# Patient Record
Sex: Male | Born: 1940
Health system: Southern US, Community
[De-identification: ages and names within clinical notes are randomized; demographics above are authoritative.]

## PROBLEM LIST (undated history)

## (undated) DIAGNOSIS — N400 Enlarged prostate without lower urinary tract symptoms: Secondary | ICD-10-CM

## (undated) DIAGNOSIS — K649 Unspecified hemorrhoids: Secondary | ICD-10-CM

## (undated) DIAGNOSIS — Z923 Personal history of irradiation: Secondary | ICD-10-CM

## (undated) DIAGNOSIS — N139 Obstructive and reflux uropathy, unspecified: Secondary | ICD-10-CM

## (undated) DIAGNOSIS — Z8489 Family history of other specified conditions: Secondary | ICD-10-CM

## (undated) DIAGNOSIS — E785 Hyperlipidemia, unspecified: Secondary | ICD-10-CM

## (undated) DIAGNOSIS — R011 Cardiac murmur, unspecified: Secondary | ICD-10-CM

## (undated) DIAGNOSIS — R972 Elevated prostate specific antigen [PSA]: Secondary | ICD-10-CM

## (undated) DIAGNOSIS — R0602 Shortness of breath: Secondary | ICD-10-CM

## (undated) DIAGNOSIS — I509 Heart failure, unspecified: Secondary | ICD-10-CM

## (undated) DIAGNOSIS — I421 Obstructive hypertrophic cardiomyopathy: Secondary | ICD-10-CM

## (undated) DIAGNOSIS — C61 Malignant neoplasm of prostate: Secondary | ICD-10-CM

## (undated) DIAGNOSIS — I2510 Atherosclerotic heart disease of native coronary artery without angina pectoris: Secondary | ICD-10-CM

## (undated) DIAGNOSIS — I429 Cardiomyopathy, unspecified: Secondary | ICD-10-CM

## (undated) DIAGNOSIS — Z9889 Other specified postprocedural states: Secondary | ICD-10-CM

## (undated) HISTORY — DX: Obstructive and reflux uropathy, unspecified: N13.9

## (undated) HISTORY — PX: COLONOSCOPY: SHX174

## (undated) HISTORY — PX: BAND HEMORRHOIDECTOMY: SHX1213

## (undated) HISTORY — DX: Elevated prostate specific antigen (PSA): R97.20

## (undated) HISTORY — DX: Hyperlipidemia, unspecified: E78.5

## (undated) HISTORY — DX: Personal history of irradiation: Z92.3

## (undated) HISTORY — DX: Cardiomyopathy, unspecified: I42.9

## (undated) HISTORY — DX: Malignant neoplasm of prostate: C61

## (undated) HISTORY — PX: HERNIA REPAIR: SHX51

## (undated) HISTORY — DX: Benign prostatic hyperplasia without lower urinary tract symptoms: N40.0

## (undated) HISTORY — DX: Other specified postprocedural states: Z98.890

## (undated) HISTORY — DX: Cardiac murmur, unspecified: R01.1

---

## 1999-04-05 ENCOUNTER — Encounter (INDEPENDENT_AMBULATORY_CARE_PROVIDER_SITE_OTHER): Payer: Self-pay | Admitting: Specialist

## 1999-04-05 ENCOUNTER — Ambulatory Visit (HOSPITAL_COMMUNITY): Admission: RE | Admit: 1999-04-05 | Discharge: 1999-04-05 | Payer: Self-pay | Admitting: Gastroenterology

## 2002-04-12 ENCOUNTER — Encounter (INDEPENDENT_AMBULATORY_CARE_PROVIDER_SITE_OTHER): Payer: Self-pay | Admitting: Specialist

## 2002-04-12 ENCOUNTER — Ambulatory Visit (HOSPITAL_COMMUNITY): Admission: RE | Admit: 2002-04-12 | Discharge: 2002-04-12 | Payer: Self-pay | Admitting: Gastroenterology

## 2004-05-10 ENCOUNTER — Ambulatory Visit (HOSPITAL_COMMUNITY): Admission: RE | Admit: 2004-05-10 | Discharge: 2004-05-11 | Payer: Self-pay | Admitting: Cardiovascular Disease

## 2004-05-10 HISTORY — PX: CARDIAC CATHETERIZATION: SHX172

## 2004-06-09 DIAGNOSIS — I421 Obstructive hypertrophic cardiomyopathy: Secondary | ICD-10-CM

## 2004-06-09 HISTORY — DX: Obstructive hypertrophic cardiomyopathy: I42.1

## 2005-02-06 ENCOUNTER — Ambulatory Visit: Payer: Self-pay | Admitting: Family Medicine

## 2005-03-19 ENCOUNTER — Ambulatory Visit (HOSPITAL_COMMUNITY): Admission: RE | Admit: 2005-03-19 | Discharge: 2005-03-19 | Payer: Self-pay | Admitting: Cardiovascular Disease

## 2005-03-25 ENCOUNTER — Ambulatory Visit (HOSPITAL_COMMUNITY): Admission: RE | Admit: 2005-03-25 | Discharge: 2005-03-25 | Payer: Self-pay | Admitting: Cardiovascular Disease

## 2005-03-25 HISTORY — PX: CARDIAC CATHETERIZATION: SHX172

## 2007-09-17 ENCOUNTER — Ambulatory Visit (HOSPITAL_COMMUNITY): Admission: RE | Admit: 2007-09-17 | Discharge: 2007-09-17 | Payer: Self-pay | Admitting: Family Medicine

## 2009-06-09 DIAGNOSIS — R972 Elevated prostate specific antigen [PSA]: Secondary | ICD-10-CM

## 2009-06-09 HISTORY — DX: Elevated prostate specific antigen (PSA): R97.20

## 2010-10-25 NOTE — Discharge Summary (Signed)
NAMEALTIN, Logan Mclean NO.:  [id number]   MEDICAL RECORD NO.:  [id number]          PATIENT TYPE:  OIB   LOCATION:  6523                         FACILITY:  [facility]   PHYSICIAN:  Abelino Derrick, P.A.   DATE OF BIRTH:  01/22/1941   DATE OF ADMISSION:  05/10/2004  DATE OF DISCHARGE:  05/11/2004                                 DISCHARGE SUMMARY   DISCHARGE DIAGNOSIS:  1.  Chest pain consistent with angina, three-site left anterior descending      intervention and stenting this admission with Cypher stents.  2.  Good left ventricular function.  3.  Dyslipidemia.   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 Alanda Amass in the past.  He had a  Cardiolite study in 2000 that was negative.  He presented May 09, 2004  with complaints of gradually increasing exertional dyspnea and chest pain  consistent with angina.  He was set up for a same-day admission, May 10, 2004.  He had catheterization done by Dr. Alanda Amass which revealed an 85%  proximal LAD lesion, a 70% mid LAD lesion, a 75% distal LAD lesion.  All  three sites were dilated and stented with Cypher stents.  The main left  circumflex and RCA were essentially normal.  Interestingly, he did have a  prominent systolic murmur in the apex prior to his catheterization but at  catheterization he had no significant MR or prolapse.  Patient tolerated the  procedure well, was stable postoperatively.  We feel he can be discharged,  May 11, 2004.  He was put on Aggrastat.   DISCHARGE MEDICATIONS:  1.  Coated aspirin once a day.  2.  Plavix 75 mg a day.  3.  Toprol XL 25 mg a day.  4.  Vytorin 10/40 once a day.  5.  Nitroglycerin sublingual p.r.n.   LABORATORY:  Sodium 135, potassium 3.7, BUN 17, creatinine 1.2.  White count  8.6, hemoglobin 13.4, hematocrit 37.8.  Platelets 262.   EKG shows sinus rhythm without acute changes.   DISPOSITION:   Patient is discharged in stable condition and will follow up  with Dr. Alanda Amass in [place], May 24, 2004.       LKK/MEDQ  D:  05/11/2004  T:  05/11/2004  Job:  [id number]   cc:   Gerlene Burdock A. Alanda Amass, M.D.  [phone] [address], [address]  [place]  [place] [address]  Fax: [phone]

## 2010-10-25 NOTE — Cardiovascular Report (Signed)
Logan Mclean, Logan Mclean NO.:  [id number]   MEDICAL RECORD NO.:  [id number]          PATIENT TYPE:  OIB   LOCATION:  6523                         FACILITY:  [facility]   PHYSICIAN:  Richard A. Alanda Amass, M.D.DATE OF BIRTH:  12-Dec-1940   DATE OF PROCEDURE:  05/10/2004  DATE OF DISCHARGE:                              CARDIAC CATHETERIZATION   PROCEDURE:  Retrograde central aortic catheterization, selective coronary  angiography via Judkins technique, pre and post intracoronary nitroglycerin  administration, LV angiogram RAO, LAO projection, subselective LIMA, RIMA,  abdominal aortic angiogram midstream PA projection, PCI with IVUS  interrogation pre and post, intracoronary nitroglycerin administration, drug-  eluting stent, and upgraded 3.5 mm and upgraded 4 mm balloon dilatation high  grade proximal LAD stenosis, drug-eluting stent with IVUS interrogation  distal LAD high grade stenosis, drug-eluting stent high grade focal flow-  limiting mid LAD stenosis 3.0, Aggrastat double bolus plus infusion,  additional Plavix 150 mg, continued aspirin.   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  He had one daughter with diabetes who died in 11-15-2022 of  brain cancer.  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  He has had strong family history of coronary  disease and has had negative treadmill exercise test in the past, but was  last seen five years ago.  He presents with two months of progressive  exertional chest pain with crescendo unstable anginal pattern, but no rest  or nocturnal discomfort.  He was started on outpatient Plavix and given 300  mg the night prior to the procedure, 300 mg the morning of the procedure,  continued aspirin, begun on beta blockers and statins for LDL of 172, and  admitted for catheterization.  Informed consent was obtained to proceed, and  the patient  was stable hemodynamically with no chest pain.  He was brought  in as a same day admission, premedicated with 5 mg of Valium p.o.,  preoperative laboratories were normal except for lipid profile.   The right groin was prepped, draped in the usual manner.  1% Xylocaine was  used for local anesthesia.  The CRFA was entered with single anterior  puncture using 18 thin-wall needle and a 6 French short Daig side arm sheath  was inserted without difficulty. Catheterization was done with 6 French 4 cm  taper Cordis preformed coronary and pigtail catheters using Omnipaque dye  throughout the procedure.  IC nitroglycerin was given at the left coronary  with repeat injections, subselective LIMA, RIMA were done with a right  coronary catheter and hand injections, and abdominal aortic angiogram  because of the patient's atherosclerotic disease and hypertension was done  in  the midstream PA projection 25 mL, 20 mL per second with visualization  down to the SFA profunda junctions bilaterally.  The patient tolerated the  diagnostic procedure well.   PRESSURES:  1.  LV:  120/0; LVEDP 18 mmHg.  2.  CA:  120/78 mmHg.  3.  Initial pressure was 150-160/85 mmHg and came  down to 120 with      intracoronary nitroglycerin administration.   There is no gradient across the aortic valve on catheter pullback.   LV angiogram demonstrated hypokinesis of the mid anterolateral wall in the  mid septum that was mild, but visible.  Overall normal EF of greater than  55%.  No mitral regurgitation or prolapse was seen angiographically.  Angiographic LVH was present.   The renal arteries were normal and smooth and single bilaterally.  The  infrarenal abdominal aorta was normal.  The iliacs including CIA, EIA, and  hypogastrics were normal.  SFA profunda junctions were normal bilaterally.  The IMA, celiac, and SMA were intact normally.   The LIMA and RIMA was widely patent with no brachiocephalic or subclavian   stenosis and the vertebrals were antegrade bilaterally with no stenosis.   Fluoroscopy showed faint calcification of the proximal LAD.  There was no  other significant calcification or intracardiac calcification visualized.   The right coronary artery was a large dominant vessel widely patent and  smooth with large PDA and PLA, two large RV branches in the mid portion.  There was no stenosis, spasm, or significant atherosclerotic lesions and  normal flow.   The main left coronary was normal.   The circumflex artery was moderately large, nondominant.  It gave off a  small atrial branch proximally, a large OM-1 that bifurcated and was normal,  and distal circumflex that was moderately large, bifurcated and was normal  with normal PABG branch.   The left anterior descending (LAD) demonstrated eccentric pancake-type (best  seen in LAO projection cranial) 85% stenosis, mildly segmental occurring  between two septal perforator branches and before the small first diagonal.  There was another area that was difficult to tell, but was appeared to be 60  or 70% narrowed between the moderate size DX 2 and the moderate size DX 3 in  the mid LAD.  There was a segmental area of 70-75% stenosis beyond the DX 3  in the mid-distal LAD.   It was elected to proceed with PCI in this setting and we felt that IVUS  interrogation was necessary to further characterize the lesions and measure  vessel sizes.  Initially, it was felt that the mid and distal LAD lesions  were borderline so we elected to proceed with proximal intervention to begin  with.   The patient was given additional 150 mg of Plavix.  He was given a total of  5900 units of heparin in divided doses, monitoring ACTs which were  therapeutic and in the lab.  The left coronary was intubated with a 6 [place]  JR-4 guiding catheter.  The lesions were crossed a 0.014 inch Asahi soft guide wire which was positioned in the distal LAD.  IVUS  interrogation was  then performed using an Atlantis Pro IVUS.   The reference proximal LAD was 4.3 mm diameter.  The lesion in the LAD was  eccentric with predominantly soft plaque and high grade measuring 1.7 x 2.2  mm.   The proximal LAD lesion was then primarily stented with a 3.5/13 drug-  eluting stent Cordis Cypher stent which was positioned fluoroscopically  between the two septals, dilated to 15-45, post dilated at 15-37.  The stent  balloon was then exchanged for a Guidant Voyager 4.0/8 balloon and the stent  was post dilated at 12 atmospheres for 52 seconds to reach approximately 4.2  mm.  There was good angiographic result and good flow through the  proximal  LAD.  While further observing the patient, he developed progressive ST  segment elevation.  We looked at the LAD and he had spasm of the distal LAD  superimposed on the segmental distal lesion.  The spasm and ST segment was  relieved with intracoronary nitroglycerin and starting IV nitroglycerin.  However, he still had residual distal LAD disease that we felt we should  IVUS.  We also removed a dilatation system and reshot the right coronary  which was widely patent, smooth and normal flow to rule out spasm in this  vessel.   The LAD was then reintubated.  The patient was stable without ST segment  elevation or chest pain.  We recrossed the stent and the LAD with the Asahi  light 0.014 inch wire.  The distal LAD had high grade stenosis with diameter  measuring 1.7 x 2.1 segmentally.  The mid LAD lesion on pullback measured  2.2 x 2.4 mm.  The distal LAD was then stented with 2.5/18 Cypher stent  which was deployed under fluoroscopic control at 12-19 and post dilated at  18-43.  Angiographically, there was good result.  It was then felt that the  mid lesion was potentially flow-limiting and was about 60-70% by IVUS with  significant soft plaque, so it was eccentrically and it was felt best to go  ahead and stent this  lesion.  It was not contiguous with the other lesions  and it was covered with a 3.0/8 Cypher stent deployed at 15-43 an post  dilated at 18-15.  Balloons were pulled back and final injections were done  in multiple projections showing excellent angiographic result.  Prior to  stenting the distal and mid LAD,  after IVUS interrogation, the proximal LAD  IVUS showed excellent stent deployment with no significant residual stenosis  and no dissection in the proximal LAD.   Angiographically, the proximal LAD stenosis was reduced from 85% to -10%.  The mid LAD was reduced from 60-70% to 0%.  The distal LAD was reduced from  75% to 0%.  There was excellent TIMI-3 flow throughout the LAD.  The second  diagonal and third diagonal were patent.  The two proximal septal  perforators were patent.   The patient was pain free.  EKG immediately showed no ST segment changes and  sinus rhythm.  Side arm sheath was flushed.  Final ACT was 272 seconds. Side arm sheath was secured with a #1 silk suture and the patient was  brought to the holding area for postoperative care.  He was in stable  condition.  We plan to continue Aggrastat for 18 hours, continue medical  therapy of his underlying coronary disease which is predominantly single  vessel.  Treatment of hypertension, hyperlipidemia.  The patient does have a  cardiac murmur, but there was no MR on angiogram and no gradient across the  aortic valve with well preserved LV function.   CATHETERIZATION DIAGNOSES:  1.  Coronary artery disease with crescendo angina, unstable.  2.  High grade proximal mid and mid-distal left anterior descending stenosis      treated with drug-eluting stenting under IVUS interrogation as outlined      above.  Excellent angiographic and IVUS result.  3.  Hyperlipidemia.  4.  Well preserved left ventricular function, mild left ventricular      hypertrophy, mild mid anterolateral wall motion abnormality.  5.  Cardiac murmur.   No mitral regurgitation, mitral valve prolapse or      aortic  stenosis angiographically.  6.  Strong family history of coronary disease.  7.  Hyperlipidemia.  8.  Mild hypertension.      Rich   RAW/MEDQ  D:  05/10/2004  T:  05/10/2004  Job:  [id number]   cc:   Nanetta Batty, M.D.  Fax: [phone]

## 2010-10-25 NOTE — Cardiovascular Report (Signed)
NAMEMARQUEL, Mclean NO.:  [id number]   MEDICAL RECORD NO.:  [id number]          PATIENT TYPE:  OIB   LOCATION:  2899                         FACILITY:  [facility]   PHYSICIAN:  Richard A. Alanda Amass, M.D.DATE OF BIRTH:  01-18-1941   DATE OF PROCEDURE:  03/25/2005  DATE OF DISCHARGE:                              CARDIAC CATHETERIZATION   PROCEDURE:  1.  Retrograde central aortic catheterization.  2.  Selective coronary angiography pre and post intracoronary nitroglycerin      administration.  3.  Subselective left internal mammary artery, left ventricular angiogram      RAO, LAO projection.  4.  Abdominal angiogram PA projection hand injection.  5.  Simultaneous left ventricular FA pressures with slow pullback.  6.  Induced PVCs with simultaneous pressures.  7.  Heparin 4000 units intravenous monitoring ACTs.  8.  IVUS interrogation left anterior descending.   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  He quit smoking almost 20 years ago.  Has a strong family history of coronary disease.  Hyperlipidemia under  therapy.  There has specifically been no syncope, presyncope, or  palpitations.  He underwent complex single vessel PCI on May 10, 2004  because of chest pain and positive Cardiolite with DES 3.5 x 13 proximal LAD  CYPHER stenting post dilated with a 4 balloon, 3 x 8 mid LAD CYPHER  stenting, 2.5 x 18 mid distal CYPHER stenting for triple tandem lesions all  on May 10, 2004 with well-preserved LV function and mild mid  anterolateral wall motion abnormality.  He has a known chronic systolic  murmur and there was no gradient across the aortic valve at prior  catheterization.   The patient has been on chronic aspirin, Plavix, and medical therapy with  low-dose beta blockers.  He has had recurrent post  prandial substernal  pressure specifically related to exertion and intermittent exertional  dyspnea usually worse after eating a meal.  He underwent Cardiolite stress  testing on December 11, 2004 that was felt to be a moderate to high risk study  with a positive stress test with drop in blood pressure from 112 to 95 in  recovery, achieving 75% PMHR with 2.5-3 mm horizontal inferolateral ST  depression four minutes into the recovery at 9.3 METS.  He had diaphragmatic  attenuation without significant ischemia and EF greater than 65%.  In view  of his recurrent symptoms and Cardiolite with past PCI it was elected to  proceed with coronary angiography.  Outpatient echocardiogram was done in  view of his cardiac murmur.  This demonstrated LV outflow tract obstruction  with good opening of the aortic valve, mild left atrial dilatation with mild  to moderate MR, eccentric jet.  He had mitral valve (SAM) and elevated  Doppler velocities compatible with a mean gradient of outflow tract of  approximately 20 mmHg compatible with IHSS.   The patient was brought to the second floor CP laboratory  in a post  absorptive state after 5 mg Valium p.o. pre medication.  He was admitted as  a same day admission with normal preoperative laboratory.  He was hydrated  preoperatively, brought to the second floor CP laboratory.  The right was  prepped, draped in the usual manner.  1% Xylocaine was used for local  anesthesia and the RCFA was entered with single anterior puncture using 18  thin-wall needle.  A 6-French short __________ sidearm sheath were inserted  without difficulty. Using guidewire exchange, selective coronary angiography  was done with 6-French 4 cm taper preformed Cordis coronary and pigtail  catheters.  Subselective LIMA was done with the right coronary catheter.  LV  angiogram was done in the RAO and LAO projection through a 6-French pigtail  catheter which easily crossed the aortic valve.  This was  done at 25 mL, 14  mL/second for each projection.  Slow pullback across the mid LV and LV  outflow tract demonstrated gradient.  There was no significant gradient  across the aortic valve.  Abdominal aortic angiogram in the mid stream PA  projection by hand demonstrated normal single renal arteries bilaterally and  normal infrarenal abdominal aorta on limited injection.  Catheter was  removed.  Side-arm sheath was flushed and femoral angiogram revealed good  puncture to the RCFA sheath placement with a normal-appearing vessel.  We  then switched to a 5-French pigtail catheter and measured simultaneous LV  and FA pressures at rest on slow pullback and with induced PVCs.  Catheter  was removed.  He was given 4000 units of heparin for IVUS interrogation.  ACT was therapeutic.  The left coronary was intubated with a JL4 6-French  Cordis guiding catheter.  The LAD was traversed to the distal portion with  an Asahi 0.014 inch soft wire and IVUS interrogation full pullback of the  LAD was done with an Atlantis Pro Scimed IVUS.  This essentially  demonstrated no significant restenosis of the three proximal mid and mid-  distal LAD stents and no significant stenosis between the second and third  stents in the mid LAD at a mild bend in the artery.  The IVUS was removed.  The catheter was removed.  Sidearm sheath was flushed and the right femoral  arteriotomy was closed with a 6-French StarClose nitinol clip device  successfully.  The patient was transferred to the holding area for  postoperative care in stable condition.  He tolerated the procedure well.   There was no significant resting gradient across the aortic valve (less than  5 mmHg).   There was a 20-60 mmHg mid LV and LV outflow tract gradient on catheter  pullback.   With induced PVCs there was a positive Brockenbrough sign with greater than  150 mm post PVC gradient demonstrated with ventricular bigemini.  LV angiogram demonstrated  some very mild mid LV cavity constriction, but no  obliteration.  There was angiographic LVH.  No segmental wall motion  abnormality and EF greater than 60%.  In the RAO projection there was +1  mitral regurgitation and there was trace mitral regurgitation in the LAO  projection.  There was no evidence of RV filling or VSD on lateral  projection.   Fluoroscopy showed good position of the proximal mid and mid-distal LAD  stents.  There was no significant coronary, intracardiac, valvular  calcification seen.   The main left coronary artery is normal.   The large circumflex artery was widely patent and smooth throughout giving  off a left atrial branch proximally, a large bifurcating OM1 from the mid  portion, OM2, OM3, and distal circumflex widely patent, smooth, and normal.  PABG branch bifurcated and was normal.   The right coronary was a highly dominant vessel, widely patent and smooth  throughout its course with a large RV branch from the proximal, and  bifurcating RV branch from the mid portion, normal PDA and PLA.   Left anterior descending artery had a large metallic stent in the proximal  third before the DX1.  The SP1 came off from within the stent.  The stent  had less than 10% narrowing with good flow and was widely patent.  Beyond  this the DX1 had 70% ostial narrowing with good flow, was a moderate sized  vessel.   There was 20-30% mild narrowing between the DX1 and a large DX2.  The DX2  was moderately large, bifurcated, and had no significant stenosis.  This was  followed by another angiographic stent which had less than 10% narrowing.  There was a third angiographic stent beyond the DX3 which was normal in the  junction of the mid and distal third of the LAD and this was widely patent  with less than 10% narrowing.   There was eccentric 40% or less narrowing of the LAD between the DX3 and  distal stent in the unstented area between the mid and distal LAD stents.   This had some mild hypodensity in some projections, no evidence of thrombus.   IVUS interrogation demonstrated diffuse plaques throughout the LAD that was  non-obstructive.  There was excellent stent apposition of all three stents.  The lumen of the distal LAD stent measured 2.2 x 2 mm with a 3.3 sq mm  diameter.   The mid LAD stent showed good apposition, measured 2.7 x 2.3 mm with a 4.5  sq mm lumen.  The proximal LAD stent also had good apposition and measured  3.7 x 3.4 mm with a 9.8 sq mm lumen.  The mid LAD lesion measured 2.2 x 2 mm  with a 3.5 sq mm area and eccentric plaque with good residual lumen.   Patient has widely patent stents as outlined above, moderate non-obstructive  disease with soft plaque throughout the LAD between the stented areas, and  normal angiographically-appearing large circumflex and dominant RCA.   He has IHSS with a mid ventricular and LV outflow tract gradient at rest (he did receive IC nitroglycerin 100 mcg during coronary angiography).  He has a  positive Brockenbrough sign with a post PVC gradient.  I would recommend  medical therapy of his residual coronary disease and his IHSS which is  minimally symptomatic.  His EKG changes with stress are probably related to  his IHSS and some of his exertional dyspnea symptoms and vague post prandial  exertional chest fullness may be related to this as well.  He also has a  history of GERD which is stable.   CATHETERIZATION DIAGNOSES:  1.  ASHD, chest pain, and positive Cardiolite with triple tandem proximal      mid and mid-distal left anterior descending DES CYPHER stenting May 10, 2004.  No restenosis on this study angiographically and with IVUS      interrogation.  2.  IHSS with musical cardiac murmur, chronic, essentially minimally      symptomatic with exertional dyspnea and post prandial exertional chest      pressure.  No syncope, presyncope, or palpitations.  No family history.  3.   Hyperlipidemia on therapy.  4.  Gastroesophageal reflux disease.  5.  Remote hemorrhoids.  6.  Remote polypectomy, Dr. Kinnie Scales.  7.  Mild mitral regurgitation, variable, related to IHSS.  8.  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  9.  Mild benign prostatic hypertrophy symptoms.  10. Remote smoker, quit over 20 years ago.  Occasional chewing tobacco.      Richard A. Alanda Amass, M.D.  Electronically Signed     RAW/MEDQ  D:  03/25/2005  T:  03/25/2005  Job:  [id number]   cc:   CP Lab

## 2011-07-03 HISTORY — PX: NM MYOCAR PERF WALL MOTION: HXRAD629

## 2012-02-12 ENCOUNTER — Encounter (HOSPITAL_COMMUNITY): Payer: Self-pay | Admitting: Pharmacy Technician

## 2012-02-12 NOTE — Patient Instructions (Addendum)
[address]  02/12/2012   Your procedure is scheduled on:  Thursday, 02/19/12  Report to [facility] at 0730 AM.  Call this number if you have problems the morning of surgery: [phone]   Remember:   Do not eat food:After Midnight.  May have clear liquids:until Midnight .  Clear liquids include soda, tea, black coffee, apple or grape juice, broth.  Take these medicines the morning of surgery with A SIP OF WATER: metoprolol. Follow instructions given to you by your cardiologist concerning your blood thinners.   Do not wear jewelry, make-up or nail polish.  Do not wear lotions, powders, or perfumes. You may wear deodorant.  Do not shave 48 hours prior to surgery. Men may shave face and neck.  Do not bring valuables to the hospital.  Contacts, dentures or bridgework may not be worn into surgery.  Leave suitcase in the car. After surgery it may be brought to your room.  For patients admitted to the hospital, checkout time is 11:00 AM the day of discharge.   Patients discharged the day of surgery will not be allowed to drive home.  Name and phone number of your driver: family  Special Instructions: CHG Shower Use Special Wash: 1/2 bottle night before surgery and 1/2 bottle morning of surgery.   Please read over the following fact sheets that you were given: Pain Booklet, MRSA Information, Surgical Site Infection Prevention, Anesthesia Post-op Instructions and Care and Recovery After Surgery  Prostate Biopsy TRUS Biopsy BEFORE THE TEST   Do not take aspirin. Do not take any medicine that has aspirin in it 7 days before your biopsy.   You may be given a medicine to take on the day of your biopsy.   You may also be given a medicine or treatment to help you go poop (laxative or enema).  AFTER THE TEST  Only take medicine as told by your doctor.   It is normal to have some bleeding from your rectum for the first 5 days.   You may have blood in your pee (urine) or sperm.  Finding out  the results of your test Ask when your test results will be ready. Make sure you get your test results. GET HELP RIGHT AWAY IF:  You have a temperature by mouth above 102 F (38.9 C), not controlled by medicine.   You have blood in your pee for more than 5 days.   You have a lot of blood in your pee.   You have bleeding from your rectum for more than 5 days or have a lot of blood in your poop (feces).   You have severe pain.  Document Released: 05/14/2009 Document Revised: 05/15/2011 Document Reviewed: 05/14/2009 [facility] Patient Information 2012 [place], [place].

## 2012-02-13 ENCOUNTER — Encounter (HOSPITAL_COMMUNITY)
Admission: RE | Admit: 2012-02-13 | Discharge: 2012-02-13 | Disposition: A | Discharge status: Home / Self Care | Payer: Medicare Other | Source: Ambulatory Visit | Attending: Urology | Admitting: Urology

## 2012-02-13 ENCOUNTER — Encounter (HOSPITAL_COMMUNITY): Payer: Self-pay

## 2012-02-13 HISTORY — DX: Atherosclerotic heart disease of native coronary artery without angina pectoris: I25.10

## 2012-02-13 LAB — BASIC METABOLIC PANEL
BUN: 16 mg/dL (ref 6–23)
CO2: 30 mEq/L (ref 19–32)
Calcium: 9.2 mg/dL (ref 8.4–10.5)
Chloride: 100 mEq/L (ref 96–112)
Creatinine, Ser: 1.12 mg/dL (ref 0.50–1.35)
GFR calc Af Amer: 74 mL/min — ABNORMAL LOW (ref >90)
GFR calc non Af Amer: 64 mL/min — ABNORMAL LOW (ref >90)
Glucose, Bld: 93 mg/dL (ref 70–99)
Potassium: 4.3 mEq/L (ref 3.5–5.1)
Sodium: 135 mEq/L (ref 135–145)

## 2012-02-13 LAB — SURGICAL PCR SCREEN
MRSA, PCR: NEGATIVE
Staphylococcus aureus: NEGATIVE

## 2012-02-13 LAB — HEMOGLOBIN AND HEMATOCRIT, BLOOD
HCT: 40.1 % (ref 39.0–52.0)
Hemoglobin: 14.4 g/dL (ref 13.0–17.0)

## 2012-02-13 NOTE — Progress Notes (Signed)
02/13/12 0753  OBSTRUCTIVE SLEEP APNEA  Have you ever been diagnosed with sleep apnea through a sleep study? No  Do you snore loudly (loud enough to be heard through closed doors)?  1  Do you often feel tired, fatigued, or sleepy during the daytime? 1  Has anyone observed you stop breathing during your sleep? 0  Do you have, or are you being treated for high blood pressure? 0  BMI more than 35 kg/m2? 0  Age over 71 years old? 1  Neck circumference greater than 40 cm/18 inches? 0  Gender: 1  Obstructive Sleep Apnea Score 4   Score 4 or greater  Updated health history;Results sent to PCP

## 2012-02-18 NOTE — H&P (Signed)
NAME:  Logan Mclean, Logan Mclean NO.:  [id number]  MEDICAL RECORD NO.:  [id number]  LOCATION:  [facility]                         FACILITY:  [facility]  PHYSICIAN:  Ky Barban, M.D.DATE OF BIRTH:  1940-09-09  DATE OF ADMISSION:  02/19/2012 DATE OF DISCHARGE:  LH                             HISTORY & PHYSICAL   CHIEF COMPLAINT:  Elevated PSA.  HISTORY:  A 71 year old gentleman who is patient of Dr. Rito Ehrlich and has elevated PSA.  He is being followed since 2011.  His PSA has gone up further.  Last time it was 5.4 about a year ago, now it is up to 8. Free PSA 12.9, both values are abnormal and Dr. Rito Ehrlich already talked to him about doing prostate biopsy.  I also spoke to him to undergo prostate biopsy for which he is coming as outpatient to have it done under anesthesia.  The other problem is that he is on Plavix, which his cardiologist has discontinued and he has recommended that he should be well hydrated, which we will give him IV fluids and under anesthesia, I will do a prostate biopsy as outpatient.  Procedure, risks limitation, complications have been discussed.  Possibility of a remote infection is also discussed.  He does have symptoms of prostatism and has been taking Flomax with good resolution of the symptoms.  No history of having significant voiding difficulty and lower back pain, hematuria or dysuria at present.  PAST MEDICAL HISTORY:  History of having BPH, obstructive uropathy, cardiomyopathy status post bilateral inguinal hernia repair, elevated cholesterol.  ALLERGIES:  Compazine.  REVIEW OF SYSTEMS:  Unremarkable.  PERSONAL HISTORY:  Does not smoke or drink.  PHYSICAL EXAMINATION:  VITAL SIGNS:  Blood pressure 136/65, temperature is normal. CENTRAL NERVOUS SYSTEM:  No gross neurological deficit. HEAD, NECK, EYES, ENT:  Negative. CHEST:  Symmetrical. HEART:  Regular sinus rhythm.  No murmur. ABDOMEN:  Soft, flat.  Liver, spleen, kidneys are not  palpable.  No CVA tenderness. GU:  External genitalia is unremarkable. RECTAL:  Prostate 1.5+ smooth and firm.  IMPRESSION: 1. Elevated PSA. 2. Cardiomyopathy.  PLAN:  Transrectal needle biopsy of the prostate under anesthesia as outpatient.     Ky Barban, M.D.     MIJ/MEDQ  D:  02/18/2012  T:  02/18/2012  Job:  [id number]

## 2012-02-19 ENCOUNTER — Ambulatory Visit (HOSPITAL_COMMUNITY): Payer: Medicare Other | Admitting: Anesthesiology

## 2012-02-19 ENCOUNTER — Encounter (HOSPITAL_COMMUNITY): Payer: Self-pay | Admitting: *Deleted

## 2012-02-19 ENCOUNTER — Encounter (HOSPITAL_COMMUNITY)
Admission: RE | Disposition: A | Discharge status: Home / Self Care | Payer: Self-pay | Source: Ambulatory Visit | Attending: Urology

## 2012-02-19 ENCOUNTER — Encounter (HOSPITAL_COMMUNITY): Payer: Self-pay | Admitting: Anesthesiology

## 2012-02-19 ENCOUNTER — Ambulatory Visit (HOSPITAL_COMMUNITY)
Admission: RE | Admit: 2012-02-19 | Discharge: 2012-02-19 | Disposition: A | Discharge status: Home / Self Care | Payer: Medicare Other | Source: Ambulatory Visit | Attending: Urology | Admitting: Urology

## 2012-02-19 DIAGNOSIS — C61 Malignant neoplasm of prostate: Secondary | ICD-10-CM

## 2012-02-19 DIAGNOSIS — Z01812 Encounter for preprocedural laboratory examination: Secondary | ICD-10-CM | POA: Insufficient documentation

## 2012-02-19 DIAGNOSIS — N400 Enlarged prostate without lower urinary tract symptoms: Secondary | ICD-10-CM

## 2012-02-19 DIAGNOSIS — E7800 Pure hypercholesterolemia, unspecified: Secondary | ICD-10-CM | POA: Insufficient documentation

## 2012-02-19 DIAGNOSIS — R972 Elevated prostate specific antigen [PSA]: Secondary | ICD-10-CM | POA: Insufficient documentation

## 2012-02-19 HISTORY — DX: Obstructive hypertrophic cardiomyopathy: I42.1

## 2012-02-19 HISTORY — DX: Malignant neoplasm of prostate: C61

## 2012-02-19 HISTORY — PX: PROSTATE BIOPSY: SHX241

## 2012-02-19 HISTORY — DX: Unspecified hemorrhoids: K64.9

## 2012-02-19 SURGERY — BIOPSY, PROSTATE
Anesthesia: Monitor Anesthesia Care | Site: Prostate | Wound class: Clean Contaminated

## 2012-02-19 MED ORDER — CIPROFLOXACIN HCL 250 MG PO TABS: 250.0000 mg | ORAL_TABLET | Freq: Two times a day (BID) | ORAL | Status: AC

## 2012-02-19 MED ORDER — MIDAZOLAM HCL 5 MG/5ML IJ SOLN
INTRAMUSCULAR | Status: DC | PRN
  Administered 2012-02-19: 1 mg via INTRAVENOUS

## 2012-02-19 MED ORDER — CIPROFLOXACIN IN D5W 200 MG/100ML IV SOLN
INTRAVENOUS | Status: AC
  Filled 2012-02-19: qty 100

## 2012-02-19 MED ORDER — LACTATED RINGERS IV SOLN
INTRAVENOUS | Status: DC
  Administered 2012-02-19: 08:00:00 via INTRAVENOUS

## 2012-02-19 MED ORDER — MIDAZOLAM HCL 2 MG/2ML IJ SOLN
1.0000 mg | INTRAMUSCULAR | Status: DC | PRN
  Administered 2012-02-19: 1 mg via INTRAVENOUS

## 2012-02-19 MED ORDER — STERILE WATER FOR IRRIGATION IR SOLN
Status: DC | PRN
  Administered 2012-02-19: 1000 mL

## 2012-02-19 MED ORDER — FENTANYL CITRATE 0.05 MG/ML IJ SOLN
INTRAMUSCULAR | Status: DC | PRN
  Administered 2012-02-19: 25 ug via INTRAVENOUS

## 2012-02-19 MED ORDER — PROPOFOL 10 MG/ML IV EMUL
INTRAVENOUS | Status: AC
  Filled 2012-02-19: qty 20

## 2012-02-19 MED ORDER — MIDAZOLAM HCL 2 MG/2ML IJ SOLN
INTRAMUSCULAR | Status: AC
  Filled 2012-02-19: qty 2

## 2012-02-19 MED ORDER — PROPOFOL 10 MG/ML IV BOLUS
INTRAVENOUS | Status: DC | PRN
  Administered 2012-02-19: 10 mg via INTRAVENOUS

## 2012-02-19 MED ORDER — PROPOFOL INFUSION 10 MG/ML OPTIME
INTRAVENOUS | Status: DC | PRN
  Administered 2012-02-19: 75 ug/kg/min via INTRAVENOUS

## 2012-02-19 MED ORDER — ONDANSETRON HCL 4 MG/2ML IJ SOLN: 4.0000 mg | Freq: Once | INTRAMUSCULAR | Status: DC | PRN

## 2012-02-19 MED ORDER — CIPROFLOXACIN IN D5W 200 MG/100ML IV SOLN
200.0000 mg | Freq: Two times a day (BID) | INTRAVENOUS | Status: DC
  Administered 2012-02-19: 200 mg via INTRAVENOUS

## 2012-02-19 MED ORDER — FENTANYL CITRATE 0.05 MG/ML IJ SOLN: 25.0000 ug | INTRAMUSCULAR | Status: DC | PRN

## 2012-02-19 MED ORDER — FENTANYL CITRATE 0.05 MG/ML IJ SOLN
INTRAMUSCULAR | Status: AC
  Filled 2012-02-19: qty 2

## 2012-02-19 SURGICAL SUPPLY — 22 items
CLOTH BEACON ORANGE TIMEOUT ST (SAFETY) ×2 IMPLANT
COVER LIGHT HANDLE STERIS (MISCELLANEOUS) ×4 IMPLANT
COVER TABLE BACK 60X90 (DRAPES) ×2 IMPLANT
DRAPE PROXIMA HALF (DRAPES) ×2 IMPLANT
GLOVE BIO SURGEON STRL SZ7 (GLOVE) ×2 IMPLANT
GLOVE INDICATOR 6.5 STRL GRN (GLOVE) ×2 IMPLANT
GOWN STRL REIN XL XLG (GOWN DISPOSABLE) ×2 IMPLANT
KIT ROOM TURNOVER AP CYSTO (KITS) ×2 IMPLANT
LUBRICANT JELLY 4.5OZ STERILE (MISCELLANEOUS) ×2 IMPLANT
MANIFOLD NEPTUNE II (INSTRUMENTS) ×2 IMPLANT
MARKER SKIN DUAL TIP RULER LAB (MISCELLANEOUS) ×2 IMPLANT
NEEDLE BIOPSY 18X20 MAGNUM (NEEDLE) ×2 IMPLANT
NEEDLE HYPO 18GX1.5 BLUNT FILL (NEEDLE) ×2 IMPLANT
NS IRRIG 1000ML POUR BTL (IV SOLUTION) IMPLANT
PAD ARMBOARD 7.5X6 YLW CONV (MISCELLANEOUS) ×2 IMPLANT
PAD TELFA 3X4 1S STER (GAUZE/BANDAGES/DRESSINGS) ×4 IMPLANT
SPONGE GAUZE 4X4 12PLY (GAUZE/BANDAGES/DRESSINGS) ×2 IMPLANT
SPONGE LAP 18X18 X RAY DECT (DISPOSABLE) ×2 IMPLANT
SYRINGE IRR TOOMEY STRL 70CC (SYRINGE) IMPLANT
TOWEL OR 17X26 4PK STRL BLUE (TOWEL DISPOSABLE) ×2 IMPLANT
TRAY FOLEY CATH 14FR (SET/KITS/TRAYS/PACK) IMPLANT
WATER STERILE IRR 1000ML POUR (IV SOLUTION) ×2 IMPLANT

## 2012-02-19 NOTE — Transfer of Care (Signed)
Immediate Anesthesia Transfer of Care Note  Patient: Logan Mclean  Procedure(s) Performed: Procedure(s) (LRB) with comments: PROSTATE BIOPSY (N/A)  Patient Location: PACU  Anesthesia Type: MAC  Level of Consciousness: sedated  Airway & Oxygen Therapy: Patient Spontanous Breathing and Patient connected to face mask oxygen  Post-op Assessment: Report given to PACU RN  Post vital signs: Reviewed and stable  Complications: No apparent anesthesia complications

## 2012-02-19 NOTE — Progress Notes (Signed)
Awake. Denies pain. Wants to get up and see family.

## 2012-02-19 NOTE — Brief Op Note (Signed)
02/19/2012  9:27 AM  PATIENT:  Logan Mclean  71 y.o. male  PRE-OPERATIVE DIAGNOSIS:  elevated prostatic specific antigen  POST-OPERATIVE DIAGNOSIS:  elevated prostatic specific antigen  PROCEDURE:  Procedure(s) (LRB) with comments: PROSTATE BIOPSY (N/A)  SURGEON:  Surgeon(s) and Role:    * Ky Barban, MD - Primary  PHYSICIAN ASSISTANT:   ASSISTANTS: none   ANESTHESIA:   MAC  EBL:  Total I/O In: 500 [I.V.:500] Out: 0   BLOOD ADMINISTERED:none  DRAINS: none   LOCAL MEDICATIONS USED:  NONE  SPECIMEN:  Source of Specimen:  mutiple prostate biopsies transrectal  DISPOSITION OF SPECIMEN:  PATHOLOGY  COUNTS:  YES  TOURNIQUET:  * No tourniquets in log *  DICTATION: .Other Dictation: Dictation Number dictation [id number]  PLAN OF CARE: Discharge to home after PACU  PATIENT DISPOSITION:  PACU - hemodynamically stable.   Delay start of Pharmacological VTE agent (>24hrs) due to surgical blood loss or risk of bleeding:

## 2012-02-19 NOTE — Progress Notes (Signed)
Awake. Denies pain. No scrotal edema. 

## 2012-02-19 NOTE — Anesthesia Preprocedure Evaluation (Signed)
Anesthesia Evaluation  Patient identified by MRN, date of birth, ID band Patient awake    Reviewed: Allergy & Precautions, H&P , NPO status   Airway Mallampati: III TM Distance: >3 FB     Dental  (+) Teeth Intact   Pulmonary  breath sounds clear to auscultation        Cardiovascular + CAD and + Cardiac Stents CHF: hx IHSS on beta blocker. Rhythm:Regular Rate:Bradycardia     Neuro/Psych    GI/Hepatic   Endo/Other    Renal/GU      Musculoskeletal   Abdominal   Peds  Hematology   Anesthesia Other Findings   Reproductive/Obstetrics                           Anesthesia Physical Anesthesia Plan  ASA: III  Anesthesia Plan: MAC   Post-op Pain Management:    Induction: Intravenous  Airway Management Planned: Simple Face Mask  Additional Equipment:   Intra-op Plan:   Post-operative Plan:   Informed Consent: I have reviewed the patients History and Physical, chart, labs and discussed the procedure including the risks, benefits and alternatives for the proposed anesthesia with the patient or authorized representative who has indicated his/her understanding and acceptance.     Plan Discussed with:   Anesthesia Plan Comments:         Anesthesia Quick Evaluation

## 2012-02-19 NOTE — Progress Notes (Signed)
No change in H&P on reexamination.pro 

## 2012-02-19 NOTE — Progress Notes (Signed)
Awakens easily to name. No scrotal edema.

## 2012-02-19 NOTE — Anesthesia Postprocedure Evaluation (Signed)
  Anesthesia Post-op Note  Patient: Logan Mclean  Procedure(s) Performed: Procedure(s) (LRB) with comments: PROSTATE BIOPSY (N/A)  Patient Location: PACU  Anesthesia Type: MAC  Level of Consciousness: awake  Airway and Oxygen Therapy: Patient Spontanous Breathing and Patient connected to face mask oxygen  Post-op Pain: none  Post-op Assessment: Post-op Vital signs reviewed, Patient's Cardiovascular Status Stable, Respiratory Function Stable, Patent Airway and No signs of Nausea or vomiting  Post-op Vital Signs: Reviewed and stable  Complications: No apparent anesthesia complications

## 2012-02-20 NOTE — Op Note (Signed)
NAME:  EITAN, DOUBLEDAY NO.:  [id number]  MEDICAL RECORD NO.:  [id number]  LOCATION:  [facility]                          FACILITY:  [facility]  PHYSICIAN:  Ky Barban, M.D.DATE OF BIRTH:  1941/01/08  DATE OF PROCEDURE:  02/19/2012 DATE OF DISCHARGE:  02/19/2012                              OPERATIVE REPORT   PREOPERATIVE DIAGNOSIS:  Elevated prostate specific antigen.  POSTOPERATIVE DIAGNOSIS:  Elevated prostate specific antigen.  PROCEDURE:  Transrectal needle biopsy of the prostate, multiple.  ANESTHESIA:  IV sedation with MAC.  DESCRIPTION OF PROCEDURE:  With the patient laid in lithotomy position, after usual prep and drape, under MAC anesthesia, I did a digital rectal exam.  Prostate was palpated and then using a biopsy needle, multiple biopsies from the right and left side of the prostate were done without any complications, and after obtaining about 12 good pieces, I finished the procedure, and the patient left the operating room in satisfactory condition.     Ky Barban, M.D.     MIJ/MEDQ  D:  02/19/2012  T:  02/20/2012  Job:  [id number]

## 2012-02-23 ENCOUNTER — Encounter (HOSPITAL_COMMUNITY): Payer: Self-pay | Admitting: Urology

## 2012-03-17 ENCOUNTER — Ambulatory Visit
Admission: RE | Admit: 2012-03-17 | Discharge: 2012-03-17 | Disposition: A | Discharge status: Home / Self Care | Payer: Medicare Other | Source: Ambulatory Visit | Attending: Radiation Oncology | Admitting: Radiation Oncology

## 2012-03-17 ENCOUNTER — Encounter: Payer: Self-pay | Admitting: Radiation Oncology

## 2012-03-17 ENCOUNTER — Encounter: Payer: Self-pay | Admitting: *Deleted

## 2012-03-17 VITALS — BP 132/59 | HR 46 | Temp 97.8°F | Wt 168.3 lb

## 2012-03-17 DIAGNOSIS — C61 Malignant neoplasm of prostate: Secondary | ICD-10-CM

## 2012-03-17 DIAGNOSIS — F17200 Nicotine dependence, unspecified, uncomplicated: Secondary | ICD-10-CM | POA: Insufficient documentation

## 2012-03-17 DIAGNOSIS — Z510 Encounter for antineoplastic radiation therapy: Secondary | ICD-10-CM | POA: Insufficient documentation

## 2012-03-17 DIAGNOSIS — R351 Nocturia: Secondary | ICD-10-CM | POA: Insufficient documentation

## 2012-03-17 DIAGNOSIS — Z79899 Other long term (current) drug therapy: Secondary | ICD-10-CM | POA: Insufficient documentation

## 2012-03-17 DIAGNOSIS — Z800 Family history of malignant neoplasm of digestive organs: Secondary | ICD-10-CM | POA: Insufficient documentation

## 2012-03-17 DIAGNOSIS — Z7982 Long term (current) use of aspirin: Secondary | ICD-10-CM | POA: Insufficient documentation

## 2012-03-17 DIAGNOSIS — Z808 Family history of malignant neoplasm of other organs or systems: Secondary | ICD-10-CM | POA: Insufficient documentation

## 2012-03-17 DIAGNOSIS — I2510 Atherosclerotic heart disease of native coronary artery without angina pectoris: Secondary | ICD-10-CM | POA: Insufficient documentation

## 2012-03-17 DIAGNOSIS — R350 Frequency of micturition: Secondary | ICD-10-CM | POA: Insufficient documentation

## 2012-03-17 NOTE — Progress Notes (Signed)
Please see the Nurse Progress Note in the MD Initial Consult Encounter for this patient. 

## 2012-03-17 NOTE — Progress Notes (Signed)
Patient here with wife of 25 years for formal radiation consultation of prostate cancer diagnosis.

## 2012-03-17 NOTE — Progress Notes (Signed)
Radiation Oncology         [phone] ________________________________  Initial outpatient Consultation  Name: Logan Mclean MRN: [id number]  Date: 03/17/2012  DOB: 1941/02/20  WU:[id number],YNWGNFA ROBERT, MD  [name], MD   REFERRING PHYSICIAN: [name], MD  DIAGNOSIS: The encounter diagnosis was Prostate cancer.  HISTORY OF PRESENT ILLNESS::Logan Mclean is a 71 y.o. male who is seen out of the courtesy of Dr. Cyndie Chime and Dr. Jerre Simon for an opinion concerning radiation therapy as part of the management of patient's recently diagnosed adenocarcinoma of the prostate.  The patient recently was noted to have an elevated PSA of 8.0 which was up from a value of approximately 5.4 a year ago. Patient's percent free PSA was abnormal at 12.9 percent.  Patient was seen by Dr. Jerre Simon and on clinical exam the patient was noted to have a 1.5+ sized prostate gland without any palpable nodules.  Patient agreed to have a biopsy for further evaluation. This was performed with IV sedation using MAC given the patient's prior history of coronary artery disease and idiopathic hypertrophic subaortic stenosis.  His Plavix was discontinued prior to the biopsy.  He was found to have a prostatic adenocarcinoma with Gleason's score of 7 (3+4). This involved 10% of one biopsy and 60% of another biopsy. A total of 12 biopsies were performed throughout the prostate gland. There was perineural invasion present on the biopsy. Options for management including watchful waiting surgical intervention and radiation were discussed with the patient by urology. The patient is now interested in finding out more of his radiation therapy options.  PREVIOUS RADIATION THERAPY: No  PAST MEDICAL HISTORY:  has a past medical history of Coronary artery disease; Hemorrhoids; IHSS (idiopathic hypertrophic subaortic stenosis); Prostate cancer (02/19/12); BPH (benign prostatic hyperplasia); Obstructive uropathy;  Cardiomyopathy; PSA elevation (2011); H/O cardiac catheterization; and Heart murmur.    PAST SURGICAL HISTORY: Past Surgical History  Procedure Date  . Hernia repair     b/l inguinal  . Colonoscopy   . Prostate biopsy 02/19/2012    Procedure: PROSTATE BIOPSY;  Surgeon: Ky Barban, MD;  Location: [facility];  Service: Urology;  Laterality: N/A;  . Band hemorrhoidectomy     FAMILY HISTORY: family history includes Brain cancer (age of onset:30) in his daughter and Colon cancer in his mother and sister.  SOCIAL HISTORY:  reports that he has never smoked. His smokeless tobacco use includes Chew. He reports that he drinks alcohol. He reports that he does not use illicit drugs.  ALLERGIES: Compazine  MEDICATIONS:  Current Outpatient Prescriptions  Medication Sig Dispense Refill  . aspirin 81 MG tablet Take 81 mg by mouth daily.      . clopidogrel (PLAVIX) 75 MG tablet Take 75 mg by mouth daily.      . fish oil-omega-3 fatty acids 1000 MG capsule Take 1 g by mouth daily.      . metoprolol tartrate (LOPRESSOR) 25 MG tablet Take 37.5 mg by mouth daily.      . simvastatin (ZOCOR) 80 MG tablet Take 80 mg by mouth at bedtime.      . Tamsulosin HCl (FLOMAX) 0.4 MG CAPS Take 0.4 mg by mouth daily.        REVIEW OF SYSTEMS:  A 15 point review of systems is documented in the electronic medical record. This was obtained by the nursing staff. However, I reviewed this with the patient to discuss relevant findings and make appropriate changes.  He denies any  new bony pain headaches dizziness or blurred vision. He denies any problems with erectile dysfunction.   the patient completed the international prostate symptom score with total score of 29 representing severe symptomatology.  most significant scores were in urinary frequency and intermittency.   he has nocturia approximately 4-5 times at night.    PHYSICAL EXAM:  weight is 168 lb 4.8 oz (76.34 kg). His temperature is 97.8 F (36.6 C). His blood  pressure is 132/59 and his pulse is 46.   BP 132/59  Pulse 46  Temp 97.8 F (36.6 C)  Wt 168 lb 4.8 oz (76.34 kg)  General Appearance:    Alert, cooperative, no distress, appears stated age  Head:    Normocephalic, without obvious abnormality, atraumatic  Eyes:    PERRL, conjunctiva/corneas clear, EOM's intact, fundi    benign, both eyes       Ears:    Normal TM's and external ear canals, both ears  Nose:   Nares normal, septum midline, mucosa normal, no drainage    or sinus tenderness  Throat:   Lips, mucosa, and tongue normal; teeth and gums normal  Neck:   Supple, symmetrical, trachea midline, no adenopathy;       thyroid:  No enlargement/tenderness/nodules; no carotid   bruit or JVD  Back:     Symmetric, no curvature, ROM normal, no CVA tenderness  Lungs:     Clear to auscultation bilaterally, respirations unlabored  Chest wall:    No tenderness or deformity  Heart:    Regular rate and rhythm, systolic murmur is present   Abdomen:     Soft, non-tender, bowel sounds active all four quadrants,    no masses, no organomegaly, bilateral inguinal scars present   Genitalia:    Normal male without lesion, discharge or tenderness  Rectal:    Normal tone,  Prostate 2+ in size, no masses or tenderness;   Firm with palpation but no nodules   Extremities:   Extremities normal, atraumatic, no cyanosis or edema  Pulses:   2+ and symmetric all extremities  Skin:   Skin color, texture, turgor normal, no rashes or lesions  Lymph nodes:   Cervical, supraclavicular, and axillary nodes normal  Neurologic:   CNII-XII intact. Normal strength, sensation and reflexes      throughout    LABORATORY DATA:  Lab Results  Component Value Date   HGB 14.4 02/13/2012   HCT 40.1 02/13/2012   Lab Results  Component Value Date   NA 135 02/13/2012   K 4.3 02/13/2012   CL 100 02/13/2012   CO2 30 02/13/2012   No results found for this basename: ALT, AST, GGT, ALKPHOS, BILITOT    PSA 8.0 ng/ml, % free PSA 12.9  %  RADIOGRAPHY: No results found.    IMPRESSION: Stage TIC Gleason's 7 adenocarcinoma of the prostate.  I discussed with patient that his prognosis is related to his stage, Gleason's score and  presenting PSA. All these factors are favorable except for a Gleason score of 7  which is of intermediate prognosis.  We discussed watchful waiting, surgical intervention and radiation therapy. I also discussed the potential role for a limited course of androgen ablation as part of his overall management if he chooses to proceed with external beam radiation therapy.  Given the patient's significant cardiac history he may not be an ideal candidate for surgical intervention or radioactive seed implantation. I would like to discuss his prognosis concerning these issues with his cardiologist.  He  in addition has a significant IPSS test score which may not be in his favor should he proceed with radioactive seed implantation. Given all the above issues it would appear that his best choice for treatment would be to proceed with image guided intensity modulated radiation therapy. He does not feel comfortable with the concept of watchful waiting for his management.   PLAN:  The patient is unsure which treatment approach he would like to proceed with at this time. I will in the meantime discuss his situation with cardiology for further input concerning potential radioactive seed implantation or potential surgery.  In addition if the patient's prostate gland is significantly enlarged then he would not be a candidate for radioactive seed implantation in addition to  his significant IPSS score. I spent 60 minutes minutes face to face with the patient and more than 50% of that time was spent in counseling and/or coordination of care.   ------------------------------------------------    Billie Lade, PhD, MD

## 2012-03-25 ENCOUNTER — Encounter: Payer: Self-pay | Admitting: Radiation Oncology

## 2012-03-25 NOTE — Progress Notes (Signed)
   Department of Radiation Oncology  Phone:  [phone] Fax:        [phone]   Progress note  Today I spoke with Mr. Agrusa cardiologist, Susa Griffins. We discussed options for management of the patient's prostate cancer.   The patient does have a history of coronary artery disease as well as IHSS. In light of this cardiac disease Dr. Alanda Amass feels it would be better for the patient to proceed with external beam radiation therapy and to avoid anesthesia associated with either seed implantation or radical prostatectomy. To have fiducial markers placed,  the patient would require admission to the hospital and anesthesia. In light of  this issue I will avoid fiducial markers for the patient's treatment but he will undergo daily cone beam CT or megavoltage CT scan for accurate localization of the prostate for IMRT.  He will be scheduled for his simulation on November 13 after he returns from a trip to [place].

## 2012-04-20 ENCOUNTER — Ambulatory Visit
Admission: RE | Admit: 2012-04-20 | Discharge: 2012-04-20 | Disposition: A | Discharge status: Home / Self Care | Payer: Medicare Other | Source: Ambulatory Visit | Attending: Radiation Oncology | Admitting: Radiation Oncology

## 2012-04-20 DIAGNOSIS — C61 Malignant neoplasm of prostate: Secondary | ICD-10-CM

## 2012-04-21 ENCOUNTER — Other Ambulatory Visit: Payer: Self-pay | Admitting: Radiation Oncology

## 2012-04-24 NOTE — Progress Notes (Signed)
  Radiation Oncology         [phone] ________________________________  Name: Logan Mclean MRN: [id number]  Date: 04/20/2012  DOB: 04-16-1941  SIMULATION AND TREATMENT PLANNING NOTE  DIAGNOSIS: Stage TIC Gleason's 7 adenocarcinoma of the prostate   NARRATIVE:  The patient was brought to the CT Simulation planning suite.  Identity was confirmed.  All relevant records and images related to the planned course of therapy were reviewed.  The patient freely provided informed written consent to proceed with treatment after reviewing the details related to the planned course of therapy. The consent form was witnessed and verified by the simulation staff.  Then, the patient was set-up in a stable reproducible  supine position for radiation therapy.  CT images were obtained.  Surface markings were placed.  The CT images were loaded into the planning software.  Then the target and avoidance structures were contoured.  Treatment planning then occurred.  The radiation prescription was entered and confirmed.  A total of 1 complex treatment devices were fabricated. I have requested : Intensity Modulated Radiotherapy (IMRT) is medically necessary for this case for the following reason:  Rectal sparing..  I have ordered:Imaged guided therapy with daily cone beam CT scans as fiducial markers could not be safely placed.  PLAN:  The patient will receive 78.0 Gy in 40 fractions.  ________________________________    Billie Lade, PhD, MD

## 2012-04-29 ENCOUNTER — Ambulatory Visit
Admission: RE | Admit: 2012-04-29 | Discharge: 2012-04-29 | Disposition: A | Discharge status: Home / Self Care | Payer: Medicare Other | Source: Ambulatory Visit | Attending: Radiation Oncology | Admitting: Radiation Oncology

## 2012-04-30 ENCOUNTER — Ambulatory Visit
Admission: RE | Admit: 2012-04-30 | Discharge: 2012-04-30 | Disposition: A | Discharge status: Home / Self Care | Payer: Medicare Other | Source: Ambulatory Visit | Attending: Radiation Oncology | Admitting: Radiation Oncology

## 2012-05-01 ENCOUNTER — Ambulatory Visit
Admission: RE | Admit: 2012-05-01 | Discharge: 2012-05-01 | Disposition: A | Discharge status: Home / Self Care | Payer: Medicare Other | Source: Ambulatory Visit | Attending: Radiation Oncology | Admitting: Radiation Oncology

## 2012-05-03 ENCOUNTER — Ambulatory Visit
Admission: RE | Admit: 2012-05-03 | Discharge: 2012-05-03 | Disposition: A | Discharge status: Home / Self Care | Payer: Medicare Other | Source: Ambulatory Visit | Attending: Radiation Oncology | Admitting: Radiation Oncology

## 2012-05-04 ENCOUNTER — Ambulatory Visit
Admission: RE | Admit: 2012-05-04 | Discharge: 2012-05-04 | Disposition: A | Discharge status: Home / Self Care | Payer: Medicare Other | Source: Ambulatory Visit | Attending: Radiation Oncology | Admitting: Radiation Oncology

## 2012-05-04 ENCOUNTER — Encounter: Payer: Self-pay | Admitting: Radiation Oncology

## 2012-05-04 VITALS — BP 130/60 | HR 54 | Temp 97.7°F | Resp 20 | Wt 173.8 lb

## 2012-05-04 DIAGNOSIS — C61 Malignant neoplasm of prostate: Secondary | ICD-10-CM

## 2012-05-04 NOTE — Progress Notes (Signed)
[facility]    Radiation Oncology [address] [place]     Maryln Gottron, M.D. [place], [place] [address]               Billie Lade, M.D., Ph.D. Phone: [phone]      Molli Hazard A. Kathrynn Running, M.D. Fax: [phone]      Radene Gunning, M.D., Ph.D.         Lurline Hare, M.D.         Grayland Jack, M.D Weekly Treatment Management Note  Name: Logan Mclean     MRN: [id number]        CSN: [id number] Date: 05/04/2012      DOB: Jul 16, 1940  CC: [name], MD         [place]    Status: Outpatient  Diagnosis: There were no encounter diagnoses.  Current Dose: 9.75 gy  Current Fraction: 5  Planned Dose: 78.0 Gy  Narrative: [name] was seen today for weekly treatment management. The chart was checked and CBCT  were reviewed. He has noticed a little more urinary frequency during the daytime. He denies any dysuria or bowel complaints. He has nocturia 3-4 times which is his baseline. He continues on Flomax.  Compazine  Current Outpatient Prescriptions  Medication Sig Dispense Refill  . aspirin 81 MG tablet Take 81 mg by mouth daily.      . clopidogrel (PLAVIX) 75 MG tablet Take 75 mg by mouth daily.      . fish oil-omega-3 fatty acids 1000 MG capsule Take 1 g by mouth daily.      . metoprolol tartrate (LOPRESSOR) 25 MG tablet Take 37.5 mg by mouth daily.      . simvastatin (ZOCOR) 80 MG tablet Take 80 mg by mouth at bedtime.      . Tamsulosin HCl (FLOMAX) 0.4 MG CAPS Take 0.4 mg by mouth daily.       Labs:  Lab Results  Component Value Date   HGB 14.4 02/13/2012   HCT 40.1 02/13/2012   Lab Results  Component Value Date   CREATININE 1.12 02/13/2012   BUN 16 02/13/2012   NA 135 02/13/2012   K 4.3 02/13/2012   CL 100 02/13/2012   CO2 30 02/13/2012   No results found for this basename: ALT, AST, GGT, ALK, PHOS, BILITOT    Physical Examination:  weight is 173 lb 12.8 oz (78.835 kg). His oral temperature is 97.7 F (36.5 C). His blood pressure is 130/60 and  his pulse is 54. His respiration is 20.    Wt Readings from Last 3 Encounters:  05/04/12 173 lb 12.8 oz (78.835 kg)  03/17/12 168 lb 4.8 oz (76.34 kg)  02/13/12 165 lb (74.844 kg)     Lungs - Normal respiratory effort, chest expands symmetrically. Lungs are clear to auscultation, no crackles or wheezes.  Heart has regular rhythm and rate  Abdomen is soft and non tender with normal bowel sounds  Assessment:  Patient tolerating treatments well  Plan: Continue treatment per original radiation prescription

## 2012-05-04 NOTE — Progress Notes (Signed)
Post sim ed completed w/pt. Gave pt "Radiation and You" booklet w/all pertinent pages marked and discussed, re: diarrhea, fatigue, skin care, urinary issues/management, nutrition, pain. All questions answered. Pt has 2 questions for dr today, re: Flomax, full bladder. Pt states he was not instructed to have a comfortably full bladder. Pt denies pain, fatigue, loss of appetite, urinary, bowel issues today.

## 2012-05-05 ENCOUNTER — Ambulatory Visit
Admission: RE | Admit: 2012-05-05 | Discharge: 2012-05-05 | Disposition: A | Discharge status: Home / Self Care | Payer: Medicare Other | Source: Ambulatory Visit | Attending: Radiation Oncology | Admitting: Radiation Oncology

## 2012-05-07 ENCOUNTER — Ambulatory Visit: Payer: Medicare Other

## 2012-05-10 ENCOUNTER — Ambulatory Visit
Admission: RE | Admit: 2012-05-10 | Discharge: 2012-05-10 | Disposition: A | Discharge status: Home / Self Care | Payer: Medicare Other | Source: Ambulatory Visit | Attending: Radiation Oncology | Admitting: Radiation Oncology

## 2012-05-11 ENCOUNTER — Encounter: Payer: Self-pay | Admitting: Radiation Oncology

## 2012-05-11 ENCOUNTER — Ambulatory Visit
Admission: RE | Admit: 2012-05-11 | Discharge: 2012-05-11 | Disposition: A | Discharge status: Home / Self Care | Payer: Medicare Other | Source: Ambulatory Visit | Attending: Radiation Oncology | Admitting: Radiation Oncology

## 2012-05-11 VITALS — BP 132/77 | HR 53 | Resp 18 | Wt 172.9 lb

## 2012-05-11 DIAGNOSIS — C61 Malignant neoplasm of prostate: Secondary | ICD-10-CM

## 2012-05-11 NOTE — Progress Notes (Signed)
[facility]    Radiation Oncology [address] [place]     Maryln Gottron, M.D. [place], [place] [address]               Billie Lade, M.D., Ph.D. Phone: [phone]      Molli Hazard A. Kathrynn Running, M.D. Fax: [phone]      Radene Gunning, M.D., Ph.D.         Lurline Hare, M.D.         Grayland Jack, M.D Weekly Treatment Management Note  Name: Logan Mclean     MRN: [id number]        CSN: [id number] Date: 05/11/2012      DOB: 1940/07/13  CC: [name], MD         [place]    Status: Outpatient  Diagnosis: The encounter diagnosis was Prostate cancer.  Current Dose: 15.6 Gy  Current Fraction: 8  Planned Dose: 78.0 Gy  Narrative: Logan Mclean was seen today for weekly treatment management. The chart was checked and CBCT  were reviewed. He continues to tolerate the radiation treatments well without any side effects this time.  Compazine  Current Outpatient Prescriptions  Medication Sig Dispense Refill  . aspirin 81 MG tablet Take 81 mg by mouth daily.      . clopidogrel (PLAVIX) 75 MG tablet Take 75 mg by mouth daily.      . fish oil-omega-3 fatty acids 1000 MG capsule Take 1 g by mouth daily.      . metoprolol tartrate (LOPRESSOR) 25 MG tablet Take 37.5 mg by mouth daily.      . simvastatin (ZOCOR) 80 MG tablet Take 80 mg by mouth at bedtime.      . Tamsulosin HCl (FLOMAX) 0.4 MG CAPS Take 0.4 mg by mouth daily.       Labs:  Lab Results  Component Value Date   HGB 14.4 02/13/2012   HCT 40.1 02/13/2012   Lab Results  Component Value Date   CREATININE 1.12 02/13/2012   BUN 16 02/13/2012   NA 135 02/13/2012   K 4.3 02/13/2012   CL 100 02/13/2012   CO2 30 02/13/2012   No results found for this basename: ALT, AST, GGT, ALK, PHOS, BILITOT    Physical Examination:  weight is 172 lb 14.4 oz (78.427 kg). His blood pressure is 132/77 and his pulse is 53. His respiration is 18.    Wt Readings from Last 3 Encounters:  05/11/12 172 lb 14.4 oz (78.427 kg)   05/04/12 173 lb 12.8 oz (78.835 kg)  03/17/12 168 lb 4.8 oz (76.34 kg)     Lungs - Normal respiratory effort, chest expands symmetrically. Lungs are clear to auscultation, no crackles or wheezes.  Heart has regular rhythm and rate  Abdomen is soft and non tender with normal bowel sounds  Assessment:  Patient tolerating treatments well  Plan: Continue treatment per original radiation prescription

## 2012-05-11 NOTE — Progress Notes (Signed)
Patient presents to the clinic today unaccompanied for a PUT with Dr. Roselind Messier. Patient is alert and oriented to person, place, and time. No distress noted. Steady gait noted. Pleasant affect noted. Patient denies pain at this time. Patient taking flomax as directed. Patient reports his urine stream is start and stop until he can manage to empty his bladder Patient denies hematuria. Patient reports on average he gets up 3-4 times per night to void no more or less than before beginning treatment. Patient denies diarrhea. Patient denies pain with BM. Patient reports occasional burning with urination. Reported all findings to Dr. Roselind Messier.

## 2012-05-12 ENCOUNTER — Ambulatory Visit
Admission: RE | Admit: 2012-05-12 | Discharge: 2012-05-12 | Disposition: A | Discharge status: Home / Self Care | Payer: Medicare Other | Source: Ambulatory Visit | Attending: Radiation Oncology | Admitting: Radiation Oncology

## 2012-05-13 ENCOUNTER — Ambulatory Visit
Admission: RE | Admit: 2012-05-13 | Discharge: 2012-05-13 | Disposition: A | Discharge status: Home / Self Care | Payer: Medicare Other | Source: Ambulatory Visit | Attending: Radiation Oncology | Admitting: Radiation Oncology

## 2012-05-14 ENCOUNTER — Ambulatory Visit
Admission: RE | Admit: 2012-05-14 | Discharge: 2012-05-14 | Disposition: A | Discharge status: Home / Self Care | Payer: Medicare Other | Source: Ambulatory Visit | Attending: Radiation Oncology | Admitting: Radiation Oncology

## 2012-05-17 ENCOUNTER — Ambulatory Visit
Admission: RE | Admit: 2012-05-17 | Discharge: 2012-05-17 | Disposition: A | Discharge status: Home / Self Care | Payer: Medicare Other | Source: Ambulatory Visit | Attending: Radiation Oncology | Admitting: Radiation Oncology

## 2012-05-18 ENCOUNTER — Ambulatory Visit
Admission: RE | Admit: 2012-05-18 | Discharge: 2012-05-18 | Disposition: A | Discharge status: Home / Self Care | Payer: Medicare Other | Source: Ambulatory Visit | Attending: Radiation Oncology | Admitting: Radiation Oncology

## 2012-05-19 ENCOUNTER — Ambulatory Visit
Admission: RE | Admit: 2012-05-19 | Discharge: 2012-05-19 | Disposition: A | Discharge status: Home / Self Care | Payer: Medicare Other | Source: Ambulatory Visit | Attending: Radiation Oncology | Admitting: Radiation Oncology

## 2012-05-19 VITALS — BP 118/66 | HR 59 | Temp 98.2°F | Wt 174.7 lb

## 2012-05-19 DIAGNOSIS — C61 Malignant neoplasm of prostate: Secondary | ICD-10-CM

## 2012-05-19 NOTE — Progress Notes (Signed)
Patient here for weekly assessment of prostate cancer.Has completed 14 of 40 treatments.Nocturia up to 4 times.Denies pain.Bowels soft and normal.

## 2012-05-19 NOTE — Progress Notes (Signed)
[facility]    Radiation Oncology [address] [place]     Maryln Gottron, M.D. [place], [place] [address]               Billie Lade, M.D., Ph.D. Phone: [phone]      Molli Hazard A. Kathrynn Running, M.D. Fax: [phone]      Radene Gunning, M.D., Ph.D.         Lurline Hare, M.D.         Grayland Jack, M.D Weekly Treatment Management Note  Name: Logan Mclean     MRN: [id number]        CSN: [id number] Date: 05/19/2012      DOB: 15-Jul-1940  CC: [name], MD         [place]    Status: Outpatient  Diagnosis: The encounter diagnosis was Prostate cancer.  Current Dose: 27.3 Gy  Current Fraction: 14  Planned Dose: 78 Gy  Narrative: Logan Mclean was seen today for weekly treatment management. The chart was checked and CBCT  were reviewed. He continues to tolerate the treatments well. He has noticed some loosening of his stools but no actual diarrhea. He denies any dysuria but does have nocturia approximately 4 times per evening.  Compazine  Current Outpatient Prescriptions  Medication Sig Dispense Refill  . aspirin 81 MG tablet Take 81 mg by mouth daily.      . clopidogrel (PLAVIX) 75 MG tablet Take 75 mg by mouth daily.      . fish oil-omega-3 fatty acids 1000 MG capsule Take 1 g by mouth daily.      . metoprolol tartrate (LOPRESSOR) 25 MG tablet Take 37.5 mg by mouth daily.      . simvastatin (ZOCOR) 80 MG tablet Take 80 mg by mouth at bedtime.      . Tamsulosin HCl (FLOMAX) 0.4 MG CAPS Take 0.4 mg by mouth daily.       Labs:  Lab Results  Component Value Date   HGB 14.4 02/13/2012   HCT 40.1 02/13/2012   Lab Results  Component Value Date   CREATININE 1.12 02/13/2012   BUN 16 02/13/2012   NA 135 02/13/2012   K 4.3 02/13/2012   CL 100 02/13/2012   CO2 30 02/13/2012   No results found for this basename: ALT, AST, GGT, ALK, PHOS, BILITOT    Physical Examination:  weight is 174 lb 11.2 oz (79.243 kg). His temperature is 98.2 F (36.8 C). His blood  pressure is 118/66 and his pulse is 59.    Wt Readings from Last 3 Encounters:  05/19/12 174 lb 11.2 oz (79.243 kg)  05/11/12 172 lb 14.4 oz (78.427 kg)  05/04/12 173 lb 12.8 oz (78.835 kg)     Lungs - Normal respiratory effort, chest expands symmetrically. Lungs are clear to auscultation, no crackles or wheezes.  Heart has regular rhythm and rate  Abdomen is soft and non tender with normal bowel sounds  Assessment:  Patient tolerating treatments well  Plan: Continue treatment per original radiation prescription

## 2012-05-20 ENCOUNTER — Ambulatory Visit
Admission: RE | Admit: 2012-05-20 | Discharge: 2012-05-20 | Disposition: A | Discharge status: Home / Self Care | Payer: Medicare Other | Source: Ambulatory Visit | Attending: Radiation Oncology | Admitting: Radiation Oncology

## 2012-05-21 ENCOUNTER — Ambulatory Visit
Admission: RE | Admit: 2012-05-21 | Discharge: 2012-05-21 | Disposition: A | Discharge status: Home / Self Care | Payer: Medicare Other | Source: Ambulatory Visit | Attending: Radiation Oncology | Admitting: Radiation Oncology

## 2012-05-24 ENCOUNTER — Ambulatory Visit
Admission: RE | Admit: 2012-05-24 | Discharge: 2012-05-24 | Disposition: A | Discharge status: Home / Self Care | Payer: Medicare Other | Source: Ambulatory Visit | Attending: Radiation Oncology | Admitting: Radiation Oncology

## 2012-05-25 ENCOUNTER — Ambulatory Visit
Admission: RE | Admit: 2012-05-25 | Discharge: 2012-05-25 | Disposition: A | Discharge status: Home / Self Care | Payer: Medicare Other | Source: Ambulatory Visit | Attending: Radiation Oncology | Admitting: Radiation Oncology

## 2012-05-26 ENCOUNTER — Ambulatory Visit
Admission: RE | Admit: 2012-05-26 | Discharge: 2012-05-26 | Disposition: A | Discharge status: Home / Self Care | Payer: Medicare Other | Source: Ambulatory Visit | Attending: Radiation Oncology | Admitting: Radiation Oncology

## 2012-05-27 ENCOUNTER — Ambulatory Visit
Admission: RE | Admit: 2012-05-27 | Discharge: 2012-05-27 | Disposition: A | Discharge status: Home / Self Care | Payer: Medicare Other | Source: Ambulatory Visit | Attending: Radiation Oncology | Admitting: Radiation Oncology

## 2012-05-27 ENCOUNTER — Encounter: Payer: Self-pay | Admitting: Radiation Oncology

## 2012-05-27 VITALS — BP 133/72 | HR 59 | Temp 98.5°F | Wt 173.6 lb

## 2012-05-27 DIAGNOSIS — C61 Malignant neoplasm of prostate: Secondary | ICD-10-CM

## 2012-05-27 NOTE — Progress Notes (Signed)
[facility]    Radiation Oncology [address] [place]     Maryln Gottron, M.D. [place], [place] [address]               Billie Lade, M.D., Ph.D. Phone: [phone]      Molli Hazard A. Kathrynn Running, M.D. Fax: [phone]      Radene Gunning, M.D., Ph.D.         Lurline Hare, M.D.         Grayland Jack, M.D Weekly Treatment Management Note  Name: Logan Mclean     MRN: [id number]        CSN: [id number] Date: 05/27/2012      DOB: 11-30-1940  CC: [name], MD         [place]    Status: Outpatient  Diagnosis: The encounter diagnosis was Prostate cancer.  Current Dose: 39.0 Gy  Current Fraction: 20  Planned Dose: 78.0 Gy  Narrative: Logan Mclean was seen today for weekly treatment management. The chart was checked and CBCT  were reviewed.  He is tolerating the treatments reasonably well. He has noticed some increased urinary urgency and frequency but no dysuria. He has mild fatigue and some mild loosening of his bowels.  Compazine  Current Outpatient Prescriptions  Medication Sig Dispense Refill  . aspirin 81 MG tablet Take 81 mg by mouth daily.      . clopidogrel (PLAVIX) 75 MG tablet Take 75 mg by mouth daily.      . fish oil-omega-3 fatty acids 1000 MG capsule Take 1 g by mouth daily.      . metoprolol tartrate (LOPRESSOR) 25 MG tablet Take 37.5 mg by mouth daily.      . simvastatin (ZOCOR) 80 MG tablet Take 80 mg by mouth at bedtime.      . Tamsulosin HCl (FLOMAX) 0.4 MG CAPS Take 0.4 mg by mouth daily.       Labs:  Lab Results  Component Value Date   HGB 14.4 02/13/2012   HCT 40.1 02/13/2012   Lab Results  Component Value Date   CREATININE 1.12 02/13/2012   BUN 16 02/13/2012   NA 135 02/13/2012   K 4.3 02/13/2012   CL 100 02/13/2012   CO2 30 02/13/2012   No results found for this basename: ALT, AST, GGT, ALK, PHOS, BILITOT    Physical Examination:  weight is 173 lb 9.6 oz (78.744 kg). His temperature is 98.5 F (36.9 C). His blood pressure is  133/72 and his pulse is 59.    Wt Readings from Last 3 Encounters:  05/27/12 173 lb 9.6 oz (78.744 kg)  05/19/12 174 lb 11.2 oz (79.243 kg)  05/11/12 172 lb 14.4 oz (78.427 kg)     Lungs - Normal respiratory effort, chest expands symmetrically. Lungs are clear to auscultation, no crackles or wheezes.  Heart has regular rhythm and rate, significant murmur noted as on previous exams Abdomen is soft and non tender with normal bowel sounds  Assessment:  Patient tolerating treatments well  Plan: Continue treatment per original radiation prescription

## 2012-05-27 NOTE — Progress Notes (Signed)
Patient here for weekly assessment of pelvic radiation for prostate cancer.Completed 20 of 40 treatments.Urinary frequency and urgency.Nocturia up to 4 times.Bowels regular and soft.Mild fatigue.

## 2012-05-28 ENCOUNTER — Ambulatory Visit
Admission: RE | Admit: 2012-05-28 | Discharge: 2012-05-28 | Disposition: A | Discharge status: Home / Self Care | Payer: Medicare Other | Source: Ambulatory Visit | Attending: Radiation Oncology | Admitting: Radiation Oncology

## 2012-05-31 ENCOUNTER — Ambulatory Visit
Admission: RE | Admit: 2012-05-31 | Discharge: 2012-05-31 | Disposition: A | Discharge status: Home / Self Care | Payer: Medicare Other | Source: Ambulatory Visit | Attending: Radiation Oncology | Admitting: Radiation Oncology

## 2012-06-01 ENCOUNTER — Ambulatory Visit
Admission: RE | Admit: 2012-06-01 | Discharge: 2012-06-01 | Disposition: A | Discharge status: Home / Self Care | Payer: Medicare Other | Source: Ambulatory Visit | Attending: Radiation Oncology | Admitting: Radiation Oncology

## 2012-06-03 ENCOUNTER — Ambulatory Visit
Admission: RE | Admit: 2012-06-03 | Discharge: 2012-06-03 | Disposition: A | Discharge status: Home / Self Care | Payer: Medicare Other | Source: Ambulatory Visit | Attending: Radiation Oncology | Admitting: Radiation Oncology

## 2012-06-03 ENCOUNTER — Encounter: Payer: Self-pay | Admitting: Radiation Oncology

## 2012-06-03 VITALS — BP 142/59 | HR 60 | Temp 97.6°F | Resp 20 | Wt 171.7 lb

## 2012-06-03 DIAGNOSIS — C61 Malignant neoplasm of prostate: Secondary | ICD-10-CM

## 2012-06-03 NOTE — Progress Notes (Signed)
Pt denies pain, bowel issues, loss of appetite. He reports slight fatigue, urinary stream starts/stops, nocturia x 3-4. He states "not really any burning".

## 2012-06-03 NOTE — Progress Notes (Signed)
[facility]    Radiation Oncology [address] [place]     Maryln Gottron, M.D. [place], [place] [address]               Billie Lade, M.D., Ph.D. Phone: [phone]      Molli Hazard A. Kathrynn Running, M.D. Fax: [phone]      Radene Gunning, M.D., Ph.D.         Lurline Hare, M.D.         Grayland Jack, M.D Weekly Treatment Management Note  Name: Logan Mclean     MRN: [id number]        CSN: [id number] Date: 06/03/2012      DOB: [birth year]  CC: [name], MD         [place]    Status: Outpatient  Diagnosis: The encounter diagnosis was Prostate cancer.   Current Dose: 46.8 Gy  Current Fraction: 24  Planned Dose: 78 Gy  Narrative: Logan Mclean was seen today for weekly treatment management. The chart was checked and CBCT  were reviewed. He has noticed a little more urinary urgency and hesitancy. He does feel he is emptying his bladder.. Patient denies any dysuria or hematuria. He has noticed some loose bowels no diarrhea or rectal bleeding.  Compazine  Current Outpatient Prescriptions  Medication Sig Dispense Refill  . aspirin 81 MG tablet Take 81 mg by mouth daily.      . clopidogrel (PLAVIX) 75 MG tablet Take 75 mg by mouth daily.      . fish oil-omega-3 fatty acids 1000 MG capsule Take 1 g by mouth daily.      . metoprolol tartrate (LOPRESSOR) 25 MG tablet Take 37.5 mg by mouth daily.      . simvastatin (ZOCOR) 80 MG tablet Take 80 mg by mouth at bedtime.      . Tamsulosin HCl (FLOMAX) 0.4 MG CAPS Take 0.4 mg by mouth daily.        Physical Examination:  weight is 171 lb 11.2 oz (77.883 kg). His oral temperature is 97.6 F (36.4 C). His blood pressure is 142/59 and his pulse is 60. His respiration is 20.    Wt Readings from Last 3 Encounters:  06/03/12 171 lb 11.2 oz (77.883 kg)  05/27/12 173 lb 9.6 oz (78.744 kg)  05/19/12 174 lb 11.2 oz (79.243 kg)     Lungs - Normal respiratory effort, chest expands symmetrically. Lungs are clear to  auscultation, no crackles or wheezes.  Heart has regular rhythm and rate  Abdomen is soft and non tender with normal bowel sounds  Assessment:  Patient tolerating treatments well except for issues as above.  Plan: Continue treatment per original radiation prescription.

## 2012-06-04 ENCOUNTER — Ambulatory Visit
Admission: RE | Admit: 2012-06-04 | Discharge: 2012-06-04 | Disposition: A | Discharge status: Home / Self Care | Payer: Medicare Other | Source: Ambulatory Visit | Attending: Radiation Oncology | Admitting: Radiation Oncology

## 2012-06-07 ENCOUNTER — Ambulatory Visit
Admission: RE | Admit: 2012-06-07 | Discharge: 2012-06-07 | Disposition: A | Discharge status: Home / Self Care | Payer: Medicare Other | Source: Ambulatory Visit | Attending: Radiation Oncology | Admitting: Radiation Oncology

## 2012-06-08 ENCOUNTER — Encounter: Payer: Self-pay | Admitting: Radiation Oncology

## 2012-06-08 ENCOUNTER — Ambulatory Visit
Admission: RE | Admit: 2012-06-08 | Discharge: 2012-06-08 | Disposition: A | Discharge status: Home / Self Care | Payer: Medicare Other | Source: Ambulatory Visit | Attending: Radiation Oncology | Admitting: Radiation Oncology

## 2012-06-08 VITALS — BP 134/59 | HR 55 | Temp 97.5°F | Resp 20 | Wt 172.9 lb

## 2012-06-08 DIAGNOSIS — C61 Malignant neoplasm of prostate: Secondary | ICD-10-CM

## 2012-06-08 NOTE — Progress Notes (Signed)
Patient here weekly rad txs prostate 27/40 completed, no dysuria, but does have hesitancy and voids little at a time, nocturia x 4, sometimes has loose stools, On Flomax 0.4mg  every pm 8:10 AM

## 2012-06-08 NOTE — Progress Notes (Signed)
Weekly Management Note Current Dose: 52.65  Gy  Projected Dose:  78 Gy   Narrative:  The patient presents for routine under treatment assessment.  CBCT/MVCT images/Port film x-rays were reviewed.  The chart was checked. Urinary symptoms are stable. On flomax. Diarrhea if he has 5 treatments in a row. None this week.   Physical Findings: Weight: 172 lb 14.4 oz (78.427 kg). Unchanged  Impression:  The patient is tolerating radiation.  Plan:  Continue treatment as planned. Continue flomax.

## 2012-06-10 ENCOUNTER — Ambulatory Visit
Admission: RE | Admit: 2012-06-10 | Discharge: 2012-06-10 | Disposition: A | Discharge status: Home / Self Care | Payer: Medicare Other | Source: Ambulatory Visit | Attending: Radiation Oncology | Admitting: Radiation Oncology

## 2012-06-11 ENCOUNTER — Ambulatory Visit
Admission: RE | Admit: 2012-06-11 | Discharge: 2012-06-11 | Disposition: A | Discharge status: Home / Self Care | Payer: Medicare Other | Source: Ambulatory Visit | Attending: Radiation Oncology | Admitting: Radiation Oncology

## 2012-06-14 ENCOUNTER — Ambulatory Visit
Admission: RE | Admit: 2012-06-14 | Discharge: 2012-06-14 | Disposition: A | Discharge status: Home / Self Care | Payer: Medicare Other | Source: Ambulatory Visit | Attending: Radiation Oncology | Admitting: Radiation Oncology

## 2012-06-15 ENCOUNTER — Ambulatory Visit
Admission: RE | Admit: 2012-06-15 | Discharge: 2012-06-15 | Disposition: A | Discharge status: Home / Self Care | Payer: Medicare Other | Source: Ambulatory Visit | Attending: Radiation Oncology | Admitting: Radiation Oncology

## 2012-06-15 DIAGNOSIS — Z800 Family history of malignant neoplasm of digestive organs: Secondary | ICD-10-CM | POA: Insufficient documentation

## 2012-06-15 DIAGNOSIS — C61 Malignant neoplasm of prostate: Secondary | ICD-10-CM | POA: Insufficient documentation

## 2012-06-15 DIAGNOSIS — Z808 Family history of malignant neoplasm of other organs or systems: Secondary | ICD-10-CM | POA: Insufficient documentation

## 2012-06-15 DIAGNOSIS — I2510 Atherosclerotic heart disease of native coronary artery without angina pectoris: Secondary | ICD-10-CM | POA: Insufficient documentation

## 2012-06-15 DIAGNOSIS — R350 Frequency of micturition: Secondary | ICD-10-CM | POA: Insufficient documentation

## 2012-06-15 DIAGNOSIS — Z79899 Other long term (current) drug therapy: Secondary | ICD-10-CM | POA: Insufficient documentation

## 2012-06-15 DIAGNOSIS — R351 Nocturia: Secondary | ICD-10-CM | POA: Insufficient documentation

## 2012-06-15 DIAGNOSIS — F17200 Nicotine dependence, unspecified, uncomplicated: Secondary | ICD-10-CM | POA: Insufficient documentation

## 2012-06-15 DIAGNOSIS — Z510 Encounter for antineoplastic radiation therapy: Secondary | ICD-10-CM | POA: Insufficient documentation

## 2012-06-15 DIAGNOSIS — Z7982 Long term (current) use of aspirin: Secondary | ICD-10-CM | POA: Insufficient documentation

## 2012-06-16 ENCOUNTER — Encounter: Payer: Self-pay | Admitting: Radiation Oncology

## 2012-06-16 ENCOUNTER — Ambulatory Visit
Admission: RE | Admit: 2012-06-16 | Discharge: 2012-06-16 | Disposition: A | Discharge status: Home / Self Care | Payer: Medicare Other | Source: Ambulatory Visit | Attending: Radiation Oncology | Admitting: Radiation Oncology

## 2012-06-16 VITALS — BP 129/63 | HR 61 | Temp 98.4°F | Resp 20 | Wt 174.7 lb

## 2012-06-16 DIAGNOSIS — C61 Malignant neoplasm of prostate: Secondary | ICD-10-CM

## 2012-06-16 NOTE — Progress Notes (Signed)
[facility]    Radiation Oncology [address] [place]     Logan Mclean, M.D. [place], [place] [address]               Billie Lade, M.D., Ph.D. Phone: [phone]      Molli Hazard A. Kathrynn Running, M.D. Fax: [phone]      Radene Gunning, M.D., Ph.D.         Lurline Hare, M.D.         Grayland Jack, M.D Weekly Treatment Management Note  Name: Logan Mclean     MRN: [id number]        CSN: [id number] Date: 06/16/2012      DOB: January 23, 1941  CC: Logan Hector, MD         [place]    Status: Outpatient  Diagnosis: The encounter diagnosis was Prostate cancer.  Current Dose: 62.4 Gy  Current Fraction: 32  Planned Dose: 78 Gy  Narrative: Logan Mclean was seen today for weekly treatment management. The chart was checked and CBCT  were reviewed. He continues to tolerate his radiation treatment well. He has minimal dysuria and urgency. He has noticed some mild loose bowels and increased abdominal gas.  Compazine  Current Outpatient Prescriptions  Medication Sig Dispense Refill  . aspirin 81 MG tablet Take 81 mg by mouth daily.      . clopidogrel (PLAVIX) 75 MG tablet Take 75 mg by mouth daily.      . fish oil-omega-3 fatty acids 1000 MG capsule Take 1 g by mouth daily.      . metoprolol tartrate (LOPRESSOR) 25 MG tablet Take 37.5 mg by mouth daily.      . simvastatin (ZOCOR) 80 MG tablet Take 80 mg by mouth at bedtime.      . Tamsulosin HCl (FLOMAX) 0.4 MG CAPS Take 0.4 mg by mouth daily.         Physical Examination:  weight is 174 lb 11.2 oz (79.243 kg). His oral temperature is 98.4 F (36.9 C). His blood pressure is 129/63 and his pulse is 61. His respiration is 20.    Wt Readings from Last 3 Encounters:  06/16/12 174 lb 11.2 oz (79.243 kg)  06/08/12 172 lb 14.4 oz (78.427 kg)  06/03/12 171 lb 11.2 oz (77.883 kg)    Lungs - Normal respiratory effort, chest expands symmetrically. Lungs are clear to auscultation, no crackles or wheezes.  Heart has  regular rhythm and rate  Abdomen is soft and non tender with normal bowel sounds  Assessment:  Patient tolerating treatments well  Plan: Continue treatment per original radiation prescription

## 2012-06-16 NOTE — Progress Notes (Signed)
Patient heer weekly rad tx prostate, 32/40 completed, occasional hesitancy when voiding, has frequency, and dysuria slight at tomes stated, loose stools occasioanlly and a lot of gas, on flomax daily, no c/o pain, says he comes with empyt bladder,voids right before rad txs,"that;s what they want me to do", also when he voids  Not much comes out stated by patient 8:37 AM

## 2012-06-17 ENCOUNTER — Ambulatory Visit
Admission: RE | Admit: 2012-06-17 | Discharge: 2012-06-17 | Disposition: A | Discharge status: Home / Self Care | Payer: Medicare Other | Source: Ambulatory Visit | Attending: Radiation Oncology | Admitting: Radiation Oncology

## 2012-06-18 ENCOUNTER — Ambulatory Visit
Admission: RE | Admit: 2012-06-18 | Discharge: 2012-06-18 | Disposition: A | Discharge status: Home / Self Care | Payer: Medicare Other | Source: Ambulatory Visit | Attending: Radiation Oncology | Admitting: Radiation Oncology

## 2012-06-21 ENCOUNTER — Ambulatory Visit
Admission: RE | Admit: 2012-06-21 | Discharge: 2012-06-21 | Disposition: A | Discharge status: Home / Self Care | Payer: Medicare Other | Source: Ambulatory Visit | Attending: Radiation Oncology | Admitting: Radiation Oncology

## 2012-06-22 ENCOUNTER — Ambulatory Visit
Admission: RE | Admit: 2012-06-22 | Discharge: 2012-06-22 | Disposition: A | Discharge status: Home / Self Care | Payer: Medicare Other | Source: Ambulatory Visit | Attending: Radiation Oncology | Admitting: Radiation Oncology

## 2012-06-23 ENCOUNTER — Ambulatory Visit
Admission: RE | Admit: 2012-06-23 | Discharge: 2012-06-23 | Disposition: A | Discharge status: Home / Self Care | Payer: Medicare Other | Source: Ambulatory Visit | Attending: Radiation Oncology | Admitting: Radiation Oncology

## 2012-06-23 VITALS — BP 135/68 | HR 57 | Temp 97.8°F | Wt 175.6 lb

## 2012-06-23 DIAGNOSIS — C61 Malignant neoplasm of prostate: Secondary | ICD-10-CM

## 2012-06-23 NOTE — Progress Notes (Signed)
Patient here for weekly assessment of radiation to pelvis for prostate cancer.Has completed 37 of 40 treatments.Denies pain but does have "funny feeling at beginning of stream" which goes away during the urine flow.Stools are soft and more frequent.Has generalized fatigue, not able to work as long during the day.

## 2012-06-23 NOTE — Progress Notes (Signed)
[facility]    Radiation Oncology [address] [place]     Maryln Gottron, M.D. [place], [place] [address]               Billie Lade, M.D., Ph.D. Phone: [phone]      Molli Hazard A. Kathrynn Running, M.D. Fax: [phone]      Radene Gunning, M.D., Ph.D.         Lurline Hare, M.D.         Grayland Jack, M.D Weekly Treatment Management Note  Name: Logan Mclean     MRN: [id number]        CSN: [id number] Date: 06/23/2012      DOB: 06/19/1940  CC: [name], MD         [place]    Status: Outpatient  Diagnosis: The encounter diagnosis was Prostate cancer.  Current Dose: 72.15 Gy  Current Fraction: 37  Planned Dose: 78 Gy  Narrative: [name] was seen today for weekly treatment management. The chart was checked and CBCT  were reviewed.  He continues to tolerate his treatments extremely well.. He has noticed some slight urinary discomfort no significant dysuria or hematuria. He has noticed some mild loosening of his bowels.  Compazine  Current Outpatient Prescriptions  Medication Sig Dispense Refill  . aspirin 81 MG tablet Take 81 mg by mouth daily.      . clopidogrel (PLAVIX) 75 MG tablet Take 75 mg by mouth daily.      . fish oil-omega-3 fatty acids 1000 MG capsule Take 1 g by mouth daily.      . metoprolol tartrate (LOPRESSOR) 25 MG tablet Take 37.5 mg by mouth daily.      . simvastatin (ZOCOR) 80 MG tablet Take 80 mg by mouth at bedtime.      . Tamsulosin HCl (FLOMAX) 0.4 MG CAPS Take 0.4 mg by mouth daily.        Physical Examination:  weight is 175 lb 9.6 oz (79.652 kg). His temperature is 97.8 F (36.6 C). His blood pressure is 135/68 and his pulse is 57.    Wt Readings from Last 3 Encounters:  06/23/12 175 lb 9.6 oz (79.652 kg)  06/16/12 174 lb 11.2 oz (79.243 kg)  06/08/12 172 lb 14.4 oz (78.427 kg)     Lungs - Normal respiratory effort, chest expands symmetrically. Lungs are clear to auscultation, no crackles or wheezes.  Heart has  regular rhythm and rate  Abdomen is soft and non tender with normal bowel sounds  Assessment:  Patient tolerating treatments well  Plan: Continue treatment per original radiation prescription

## 2012-06-24 ENCOUNTER — Ambulatory Visit
Admission: RE | Admit: 2012-06-24 | Discharge: 2012-06-24 | Disposition: A | Discharge status: Home / Self Care | Payer: Medicare Other | Source: Ambulatory Visit | Attending: Radiation Oncology | Admitting: Radiation Oncology

## 2012-06-25 ENCOUNTER — Ambulatory Visit
Admission: RE | Admit: 2012-06-25 | Discharge: 2012-06-25 | Disposition: A | Discharge status: Home / Self Care | Payer: Medicare Other | Source: Ambulatory Visit | Attending: Radiation Oncology | Admitting: Radiation Oncology

## 2012-06-28 ENCOUNTER — Ambulatory Visit
Admission: RE | Admit: 2012-06-28 | Discharge: 2012-06-28 | Disposition: A | Discharge status: Home / Self Care | Payer: Medicare Other | Source: Ambulatory Visit | Attending: Radiation Oncology | Admitting: Radiation Oncology

## 2012-07-04 ENCOUNTER — Encounter: Payer: Self-pay | Admitting: Radiation Oncology

## 2012-07-04 NOTE — Progress Notes (Signed)
   Department of Radiation Oncology  Phone:  [phone] Fax:        [phone]   Intensity modulated radiation therapy device note  On 04/29/2012 the patient began his radiation therapy directed at the prostate area. The patient being treated with 2 rapid arcs.  This constitutes 1 IMRT device.    Intensity modulated radiation therapy simulation note  On 04/27/2012 patient's IMRT plan was completed and approved. Dose volume histograms of the target area as well as critical structures are reviewed accepted and placed in the patient's chart. Patient will receive 40 treatments for a cumulative dose to the prostate of 78 gray.

## 2012-07-04 NOTE — Progress Notes (Signed)
  Radiation Oncology         [phone] ________________________________  Name: ISAY PERLEBERG MRN: [id number]  Date: 07/04/2012  DOB: 11-29-1940  End of Treatment Note  Diagnosis:   Prostate cancer     Indication for treatment:  Definitive treatment       Radiation treatment dates:   04/29/2012 through 06/28/2012  Site/dose:   Prostate 78 gray in 40 treatments  Beams/energy:   Patient was treated with intensity modulated radiation therapy using VMAT  with 2 rapid arcs.  Narrative: The patient tolerated radiation treatment relatively well.   He had minimal fatigue as well as urinary and bowel symptoms during the course of his treatments.  Plan: The patient has completed radiation treatment. The patient will return to radiation oncology clinic for routine followup in one month. I advised them to call or return sooner if they have any questions or concerns related to their recovery or treatment.  -----------------------------------  Billie Lade, PhD, MD

## 2012-07-29 ENCOUNTER — Encounter: Payer: Self-pay | Admitting: Radiation Oncology

## 2012-07-29 ENCOUNTER — Ambulatory Visit
Admission: RE | Admit: 2012-07-29 | Discharge: 2012-07-29 | Disposition: A | Discharge status: Home / Self Care | Payer: Medicare Other | Source: Ambulatory Visit | Attending: Radiation Oncology | Admitting: Radiation Oncology

## 2012-07-29 VITALS — BP 136/69 | HR 53 | Temp 97.6°F | Resp 18 | Wt 173.7 lb

## 2012-07-29 DIAGNOSIS — C61 Malignant neoplasm of prostate: Secondary | ICD-10-CM

## 2012-07-29 NOTE — Progress Notes (Signed)
Patient presents to the clinic today unaccompanied for follow up appointment with Dr. Roselind Messier. Patient is alert and oriented to person, place, and time. No distress noted. Steady gait noted. Pleasant affect noted. Patient denies pain at this time. Patient reports on average he gets up 3-4 times during the night to void. Patient reports that during the day he can void and turn around two minutes later and void again. Patient reports that he continues to take flomax but, wonders if he should continues. Patient denies burning with urination. Patient denies hematuria. Patient reports occasional soft stool and gas. Patient reports the consistency of is stool is different than it was prior to radiation. Patient nausea, vomiting, headache, or dizzines. Patient denies night sweats or unintentional weight loss. Reported all findings to Dr. Roselind Messier.

## 2012-07-29 NOTE — Progress Notes (Signed)
  Radiation Oncology         [phone] ________________________________  Name: Logan Mclean MRN: [id number]  Date: 07/29/2012  DOB: 01-Feb-1941  Follow-Up Visit Note  CC: [name], MD  [name], MD  Diagnosis:   Prostate cancer  Interval Since Last Radiation:  1  months  Narrative:  The patient returns today for routine follow-up.  He is doing well this time. He has intermittent dysuria but no consistent discomfort. He denies any hematuria or significant frequency. He denies any bowel problems. His energy level is good at this time.                              ALLERGIES:  is allergic to compazine.  Meds: Current Outpatient Prescriptions  Medication Sig Dispense Refill  . aspirin 81 MG tablet Take 81 mg by mouth daily.      . clopidogrel (PLAVIX) 75 MG tablet Take 75 mg by mouth daily.      . metoprolol tartrate (LOPRESSOR) 25 MG tablet Take 37.5 mg by mouth daily.      . simvastatin (ZOCOR) 80 MG tablet Take 80 mg by mouth at bedtime.      . Tamsulosin HCl (FLOMAX) 0.4 MG CAPS Take 0.4 mg by mouth daily.      . fish oil-omega-3 fatty acids 1000 MG capsule Take 1 g by mouth daily.       No current facility-administered medications for this encounter.    Physical Findings: The patient is in no acute distress. Patient is alert and oriented.  weight is 173 lb 11.2 oz (78.79 kg). His oral temperature is 97.6 F (36.4 C). His blood pressure is 136/69 and his pulse is 53. His respiration is 18 and oxygen saturation is 100%. .  The lungs are clear. The heart has a regular rhythm and rate. The abdomen is soft and nontender with normal bowel sounds.     Impression:  The patient is recovering from the effects of radiation.  He will followup with Dr. Jesse Fall in approximately 2-3 months for his first post radiation PSA. The patient returns for routine followup in radiation oncology in 6 months. Patient will continue on Flomax for the time  being.    _____________________________________    Billie Lade, PhD, MD

## 2012-07-30 ENCOUNTER — Telehealth: Payer: Self-pay | Admitting: Radiation Oncology

## 2012-07-30 NOTE — Telephone Encounter (Signed)
Cancer claim form completed and sent to pt's via mail today.    COPY SCANNED

## 2012-08-04 ENCOUNTER — Other Ambulatory Visit (HOSPITAL_COMMUNITY): Payer: Self-pay | Admitting: Cardiovascular Disease

## 2012-08-04 DIAGNOSIS — I714 Abdominal aortic aneurysm, without rupture, unspecified: Secondary | ICD-10-CM

## 2012-08-04 DIAGNOSIS — I421 Obstructive hypertrophic cardiomyopathy: Secondary | ICD-10-CM

## 2012-08-04 DIAGNOSIS — I272 Pulmonary hypertension, unspecified: Secondary | ICD-10-CM

## 2012-08-04 DIAGNOSIS — I351 Nonrheumatic aortic (valve) insufficiency: Secondary | ICD-10-CM

## 2012-08-04 DIAGNOSIS — R0989 Other specified symptoms and signs involving the circulatory and respiratory systems: Secondary | ICD-10-CM

## 2012-08-26 ENCOUNTER — Ambulatory Visit (HOSPITAL_COMMUNITY)
Admission: RE | Admit: 2012-08-26 | Discharge: 2012-08-26 | Disposition: A | Discharge status: Home / Self Care | Payer: Medicare Other | Source: Ambulatory Visit | Attending: Cardiovascular Disease | Admitting: Cardiovascular Disease

## 2012-08-26 DIAGNOSIS — R0989 Other specified symptoms and signs involving the circulatory and respiratory systems: Secondary | ICD-10-CM

## 2012-08-26 DIAGNOSIS — I421 Obstructive hypertrophic cardiomyopathy: Secondary | ICD-10-CM | POA: Insufficient documentation

## 2012-08-26 DIAGNOSIS — I272 Pulmonary hypertension, unspecified: Secondary | ICD-10-CM

## 2012-08-26 DIAGNOSIS — I2510 Atherosclerotic heart disease of native coronary artery without angina pectoris: Secondary | ICD-10-CM | POA: Insufficient documentation

## 2012-08-26 DIAGNOSIS — R011 Cardiac murmur, unspecified: Secondary | ICD-10-CM | POA: Insufficient documentation

## 2012-08-26 DIAGNOSIS — I714 Abdominal aortic aneurysm, without rupture: Secondary | ICD-10-CM

## 2012-08-26 DIAGNOSIS — I079 Rheumatic tricuspid valve disease, unspecified: Secondary | ICD-10-CM | POA: Insufficient documentation

## 2012-08-26 DIAGNOSIS — I351 Nonrheumatic aortic (valve) insufficiency: Secondary | ICD-10-CM

## 2012-08-26 DIAGNOSIS — I359 Nonrheumatic aortic valve disorder, unspecified: Secondary | ICD-10-CM | POA: Insufficient documentation

## 2012-08-26 HISTORY — PX: TRANSTHORACIC ECHOCARDIOGRAM: SHX275

## 2012-08-26 NOTE — Progress Notes (Signed)
Carotid Duplex Completed. Logan Mclean  

## 2012-08-26 NOTE — Progress Notes (Signed)
Aorta Duplex Completed. Monroe Toure D  

## 2012-08-26 NOTE — Progress Notes (Signed)
2D Echo Performed 08/26/2012    Clearence Ped, RCS

## 2012-10-29 ENCOUNTER — Inpatient Hospital Stay (HOSPITAL_COMMUNITY)
Admission: EM | Admit: 2012-10-29 | Discharge: 2012-11-01 | DRG: 292 | Disposition: A | Discharge status: Home / Self Care | Payer: Medicare Other | Attending: Internal Medicine | Admitting: Internal Medicine

## 2012-10-29 ENCOUNTER — Emergency Department (HOSPITAL_COMMUNITY): Payer: Medicare Other

## 2012-10-29 ENCOUNTER — Encounter (HOSPITAL_COMMUNITY): Payer: Self-pay | Admitting: Family Medicine

## 2012-10-29 DIAGNOSIS — N400 Enlarged prostate without lower urinary tract symptoms: Secondary | ICD-10-CM | POA: Diagnosis present

## 2012-10-29 DIAGNOSIS — I5031 Acute diastolic (congestive) heart failure: Principal | ICD-10-CM | POA: Diagnosis present

## 2012-10-29 DIAGNOSIS — I2510 Atherosclerotic heart disease of native coronary artery without angina pectoris: Secondary | ICD-10-CM | POA: Diagnosis present

## 2012-10-29 DIAGNOSIS — I509 Heart failure, unspecified: Secondary | ICD-10-CM | POA: Diagnosis present

## 2012-10-29 DIAGNOSIS — Z8546 Personal history of malignant neoplasm of prostate: Secondary | ICD-10-CM

## 2012-10-29 DIAGNOSIS — I428 Other cardiomyopathies: Secondary | ICD-10-CM | POA: Diagnosis present

## 2012-10-29 DIAGNOSIS — C61 Malignant neoplasm of prostate: Secondary | ICD-10-CM | POA: Diagnosis present

## 2012-10-29 DIAGNOSIS — R011 Cardiac murmur, unspecified: Secondary | ICD-10-CM | POA: Diagnosis present

## 2012-10-29 DIAGNOSIS — Z923 Personal history of irradiation: Secondary | ICD-10-CM

## 2012-10-29 DIAGNOSIS — I421 Obstructive hypertrophic cardiomyopathy: Secondary | ICD-10-CM | POA: Diagnosis present

## 2012-10-29 HISTORY — DX: Heart failure, unspecified: I50.9

## 2012-10-29 HISTORY — DX: Shortness of breath: R06.02

## 2012-10-29 HISTORY — DX: Family history of other specified conditions: Z84.89

## 2012-10-29 LAB — D-DIMER, QUANTITATIVE: D-Dimer, Quant: 0.27 ug/mL-FEU (ref 0.00–0.48)

## 2012-10-29 LAB — COMPREHENSIVE METABOLIC PANEL
AST: 26 U/L (ref 0–37)
Albumin: 3.7 g/dL (ref 3.5–5.2)
BUN: 17 mg/dL (ref 6–23)
Calcium: 8.5 mg/dL (ref 8.4–10.5)
Chloride: 100 mEq/L (ref 96–112)
Creatinine, Ser: 1.05 mg/dL (ref 0.50–1.35)
Total Bilirubin: 1.5 mg/dL — ABNORMAL HIGH (ref 0.3–1.2)

## 2012-10-29 LAB — CBC WITH DIFFERENTIAL/PLATELET
Basophils Absolute: 0 10*3/uL (ref 0.0–0.1)
Basophils Relative: 0 % (ref 0–1)
Eosinophils Absolute: 0.1 10*3/uL (ref 0.0–0.7)
Eosinophils Relative: 1 % (ref 0–5)
HCT: 36.3 % — ABNORMAL LOW (ref 39.0–52.0)
Hemoglobin: 13.3 g/dL (ref 13.0–17.0)
MCH: 32.4 pg (ref 26.0–34.0)
MCHC: 36.6 g/dL — ABNORMAL HIGH (ref 30.0–36.0)
MCV: 88.3 fL (ref 78.0–100.0)
Monocytes Absolute: 0.8 10*3/uL (ref 0.1–1.0)
Monocytes Relative: 9 % (ref 3–12)
RDW: 12.3 % (ref 11.5–15.5)

## 2012-10-29 LAB — CREATININE, SERUM
Creatinine, Ser: 1.18 mg/dL (ref 0.50–1.35)
GFR calc Af Amer: 69 mL/min — ABNORMAL LOW (ref >90)
GFR calc non Af Amer: 60 mL/min — ABNORMAL LOW (ref >90)

## 2012-10-29 LAB — CBC
Platelets: 134 10*3/uL — ABNORMAL LOW (ref 150–400)
RDW: 12.5 % (ref 11.5–15.5)
WBC: 6.5 10*3/uL (ref 4.0–10.5)

## 2012-10-29 LAB — TROPONIN I
Troponin I: <0.3 ng/mL (ref <0.30)
Troponin I: <0.3 ng/mL (ref <0.30)
Troponin I: <0.3 ng/mL (ref <0.30)

## 2012-10-29 LAB — PRO B NATRIURETIC PEPTIDE: Pro B Natriuretic peptide (BNP): 2400 pg/mL — ABNORMAL HIGH (ref 0–125)

## 2012-10-29 LAB — TSH: TSH: 2.416 u[IU]/mL (ref 0.350–4.500)

## 2012-10-29 MED ORDER — LISINOPRIL 2.5 MG PO TABS
2.5000 mg | ORAL_TABLET | Freq: Every day | ORAL | Status: DC
  Administered 2012-10-29 – 2012-11-01 (×4): 2.5 mg via ORAL
  Filled 2012-10-29 (×4): qty 1

## 2012-10-29 MED ORDER — ASPIRIN EC 81 MG PO TBEC: 81.0000 mg | DELAYED_RELEASE_TABLET | Freq: Every day | ORAL | Status: DC

## 2012-10-29 MED ORDER — OMEGA-3 FATTY ACIDS 1000 MG PO CAPS: 1.0000 g | ORAL_CAPSULE | Freq: Every day | ORAL | Status: DC

## 2012-10-29 MED ORDER — ASPIRIN 81 MG PO CHEW
81.0000 mg | CHEWABLE_TABLET | Freq: Every day | ORAL | Status: DC
  Administered 2012-10-29 – 2012-11-01 (×4): 81 mg via ORAL
  Filled 2012-10-29 (×4): qty 1

## 2012-10-29 MED ORDER — SODIUM CHLORIDE 0.9 % IJ SOLN: 3.0000 mL | INTRAMUSCULAR | Status: DC | PRN

## 2012-10-29 MED ORDER — FUROSEMIDE 10 MG/ML IJ SOLN
40.0000 mg | Freq: Two times a day (BID) | INTRAMUSCULAR | Status: DC
  Administered 2012-10-29 – 2012-10-30 (×2): 40 mg via INTRAVENOUS
  Filled 2012-10-29 (×3): qty 4

## 2012-10-29 MED ORDER — POTASSIUM CHLORIDE CRYS ER 20 MEQ PO TBCR
20.0000 meq | EXTENDED_RELEASE_TABLET | Freq: Two times a day (BID) | ORAL | Status: DC
  Administered 2012-10-29 – 2012-10-30 (×3): 20 meq via ORAL
  Filled 2012-10-29 (×5): qty 1

## 2012-10-29 MED ORDER — OMEGA-3-ACID ETHYL ESTERS 1 G PO CAPS
1.0000 g | ORAL_CAPSULE | Freq: Every day | ORAL | Status: DC
  Administered 2012-10-29 – 2012-11-01 (×4): 1 g via ORAL
  Filled 2012-10-29 (×4): qty 1

## 2012-10-29 MED ORDER — ZOLPIDEM TARTRATE 5 MG PO TABS: 5.0000 mg | ORAL_TABLET | Freq: Every evening | ORAL | Status: DC | PRN

## 2012-10-29 MED ORDER — TAMSULOSIN HCL 0.4 MG PO CAPS
0.4000 mg | ORAL_CAPSULE | Freq: Every day | ORAL | Status: DC
  Administered 2012-10-29 – 2012-11-01 (×4): 0.4 mg via ORAL
  Filled 2012-10-29 (×4): qty 1

## 2012-10-29 MED ORDER — ATORVASTATIN CALCIUM 40 MG PO TABS
40.0000 mg | ORAL_TABLET | Freq: Every day | ORAL | Status: DC
  Administered 2012-10-31: 40 mg via ORAL
  Filled 2012-10-29 (×4): qty 1

## 2012-10-29 MED ORDER — SODIUM CHLORIDE 0.9 % IV SOLN: 250.0000 mL | INTRAVENOUS | Status: DC | PRN

## 2012-10-29 MED ORDER — ONDANSETRON HCL 4 MG/2ML IJ SOLN: 4.0000 mg | Freq: Four times a day (QID) | INTRAMUSCULAR | Status: DC | PRN

## 2012-10-29 MED ORDER — ACETAMINOPHEN 325 MG PO TABS: 650.0000 mg | ORAL_TABLET | ORAL | Status: DC | PRN

## 2012-10-29 MED ORDER — CLOPIDOGREL BISULFATE 75 MG PO TABS
75.0000 mg | ORAL_TABLET | Freq: Every day | ORAL | Status: DC
  Administered 2012-10-29 – 2012-11-01 (×4): 75 mg via ORAL
  Filled 2012-10-29 (×5): qty 1

## 2012-10-29 MED ORDER — ALPRAZOLAM 0.25 MG PO TABS: 0.2500 mg | ORAL_TABLET | Freq: Two times a day (BID) | ORAL | Status: DC | PRN

## 2012-10-29 MED ORDER — METOPROLOL SUCCINATE ER 25 MG PO TB24
25.0000 mg | ORAL_TABLET | Freq: Every day | ORAL | Status: DC
  Administered 2012-10-29 – 2012-11-01 (×4): 25 mg via ORAL
  Filled 2012-10-29 (×4): qty 1

## 2012-10-29 MED ORDER — ASPIRIN 81 MG PO TABS: 81.0000 mg | ORAL_TABLET | Freq: Every day | ORAL | Status: DC

## 2012-10-29 MED ORDER — ENOXAPARIN SODIUM 80 MG/0.8ML ~~LOC~~ SOLN
1.0000 mg/kg | Freq: Two times a day (BID) | SUBCUTANEOUS | Status: DC
  Administered 2012-10-29 – 2012-10-30 (×2): 80 mg via SUBCUTANEOUS
  Filled 2012-10-29 (×4): qty 0.8

## 2012-10-29 MED ORDER — FUROSEMIDE 10 MG/ML IJ SOLN
40.0000 mg | Freq: Once | INTRAMUSCULAR | Status: AC
  Administered 2012-10-29: 40 mg via INTRAVENOUS
  Filled 2012-10-29: qty 4

## 2012-10-29 MED ORDER — SODIUM CHLORIDE 0.9 % IJ SOLN
3.0000 mL | Freq: Two times a day (BID) | INTRAMUSCULAR | Status: DC
  Administered 2012-10-29 – 2012-11-01 (×7): 3 mL via INTRAVENOUS

## 2012-10-29 NOTE — ED Notes (Signed)
Cardiology here to see pt.

## 2012-10-29 NOTE — ED Notes (Signed)
Report given to [facility]  

## 2012-10-29 NOTE — ED Notes (Addendum)
Patient states he has been short of breath over the past week. States shortness of breath was worse this morning. States breathing mentholatum ointment helped. States he has an appointment with Dr. Denzil Magnuson this morning but unable to tolerate shortness of breath. States shortness of breath worse when laying down. Denies chest pain.

## 2012-10-29 NOTE — ED Provider Notes (Signed)
History     CSN: [id number]  Arrival date & time 10/29/12  0630   First MD Initiated Contact with Patient 10/29/12 0701      Chief Complaint  Patient presents with  . Shortness of Breath    (Consider location/radiation/quality/duration/timing/severity/associated sxs/prior treatment) HPI Comments: Patient presents with a two-day history of shortness of breath has progressively worsening. He states he's felt "fluid in his chest for the past several days and having a difficult time lying flat. Normally is able to lay flat. Denies any history of CHF but endorses a history of hypertrophic cardiomyopathy. He has CAD with stents. Denies any chest pain, nausea, vomiting, diaphoresis. He has a cough productive of clear mucus. Denies any leg pain or swelling. Denies taking any diuretics at home. His cardiologist is Dr. Alanda Amass.  The history is provided by the patient and a relative.    Past Medical History  Diagnosis Date  . Coronary artery disease     Stents  . Hemorrhoids   . IHSS (idiopathic hypertrophic subaortic stenosis)   . Prostate cancer 02/19/12    Adenocarcinoa,3+4=7,(PSA=8.0 on 11/11/11  . BPH (benign prostatic hyperplasia)   . Obstructive uropathy   . Cardiomyopathy   . PSA elevation 2011    5.4   . H/O cardiac catheterization     several with stents  . Heart murmur   . Acute CHF 10/29/2012    Past Surgical History  Procedure Laterality Date  . Hernia repair      b/l inguinal  . Colonoscopy    . Prostate biopsy  02/19/2012    Procedure: PROSTATE BIOPSY;  Surgeon: Ky Barban, MD;  Location: [facility];  Service: Urology;  Laterality: N/A;  . Band hemorrhoidectomy      Family History  Problem Relation Age of Onset  . Colon cancer Mother   . Colon cancer Sister   . Brain cancer Daughter 30    deceased    History  Substance Use Topics  . Smoking status: Never Smoker   . Smokeless tobacco: Current User    Types: Chew  . Alcohol Use: Yes     Comment:  occasional      Review of Systems  Constitutional: Positive for activity change and appetite change. Negative for fever and fatigue.  HENT: Negative for congestion and rhinorrhea.   Respiratory: Positive for cough and shortness of breath. Negative for chest tightness.   Cardiovascular: Negative for chest pain.  Gastrointestinal: Negative for nausea, vomiting and abdominal pain.  Genitourinary: Negative for dysuria and hematuria.  Musculoskeletal: Negative for back pain.  Skin: Negative for rash.  Neurological: Negative for dizziness, weakness and headaches.  A complete 10 system review of systems was obtained and all systems are negative except as noted in the HPI and PMH.    Allergies  Compazine  Home Medications   Current Outpatient Rx  Name  Route  Sig  Dispense  Refill  . aspirin 81 MG tablet   Oral   Take 81 mg by mouth daily.         . clopidogrel (PLAVIX) 75 MG tablet   Oral   Take 75 mg by mouth daily.         Marland Kitchen doxycycline (VIBRAMYCIN) 100 MG capsule   Oral   Take 100 mg by mouth 2 (two) times daily. Pt started taking 3 doses of doxycycline.         . fish oil-omega-3 fatty acids 1000 MG capsule   Oral  Take 1 g by mouth daily.         . metoprolol tartrate (LOPRESSOR) 25 MG tablet   Oral   Take 25 mg by mouth daily.          . simvastatin (ZOCOR) 80 MG tablet   Oral   Take 80 mg by mouth at bedtime.         . Tamsulosin HCl (FLOMAX) 0.4 MG CAPS   Oral   Take 0.4 mg by mouth daily.           BP 112/52  Pulse 78  Temp(Src) 97.5 F (36.4 C) (Oral)  Resp 20  Ht 5\' 6"  (1.676 m)  Wt 171 lb (77.565 kg)  BMI 27.61 kg/m2  SpO2 98%  Physical Exam  Constitutional: He is oriented to person, place, and time. He appears well-developed and well-nourished. No distress.  Sitting upright, tachypnea, speaking in full sentences  HENT:  Head: Normocephalic and atraumatic.  Mouth/Throat: Oropharynx is clear and moist. No oropharyngeal exudate.   Eyes: Conjunctivae and EOM are normal. Pupils are equal, round, and reactive to light.  Neck: Normal range of motion. Neck supple.  Cardiovascular: Normal rate and regular rhythm.   Murmur heard. Pulmonary/Chest: Effort normal. No respiratory distress. He has rales.  Abdominal: There is no tenderness. There is no rebound and no guarding.  Musculoskeletal: Normal range of motion. He exhibits edema.  Trace pedal edema  Neurological: He is alert and oriented to person, place, and time. No cranial nerve deficit. He exhibits normal muscle tone. Coordination normal.  Skin: Skin is warm.    ED Course  Procedures (including critical care time)  Labs Reviewed  CBC WITH DIFFERENTIAL - Abnormal; Notable for the following:    RBC 4.11 (*)    HCT 36.3 (*)    MCHC 36.6 (*)    Platelets 134 (*)    All other components within normal limits  COMPREHENSIVE METABOLIC PANEL - Abnormal; Notable for the following:    Sodium 133 (*)    Glucose, Bld 105 (*)    Total Bilirubin 1.5 (*)    GFR calc non Af Amer 69 (*)    GFR calc Af Amer 80 (*)    All other components within normal limits  PRO B NATRIURETIC PEPTIDE - Abnormal; Notable for the following:    Pro B Natriuretic peptide (BNP) 2400.0 (*)    All other components within normal limits  TROPONIN I  D-DIMER, QUANTITATIVE   Dg Chest 2 View  10/29/2012   *RADIOLOGY REPORT*  Clinical Data: Shortness of breath.  Cough and congestion.  History of hypertension.  CHEST - 2 VIEW  Comparison: 03/19/2005 and 05/10/2004.  Findings: The heart size and mediastinal contours are stable. There is progressive diffuse interstitial prominence with central airway thickening and septal lines suspicious for mild edema.  No confluent airspace opacity or significant pleural effusion is seen. The osseous structures appear unremarkable.  IMPRESSION: New diffuse interstitial prominence suspicious for interstitial pulmonary edema.   Original Report Authenticated By: Carey Bullocks, M.D.     1. IHSS (idiopathic hypertrophic subaortic stenosis)   2. Heart murmur   3. CHF (congestive heart failure)   4. Acute CHF   5. CAD (coronary artery disease)       MDM  3 history of dyspnea on exertion with orthopnea. No chest pain. History of hypertrophic cardiomyopathy with aortic stenosis.history of HOCM, Stage 2 diastolic dysfunction by recent echo in 08/2012, prior CAD with stents to the  LAD and a negative myoview for ischemia in 06/2011   CXR shows interstitial edema.BNP elevated, troponin negative. Small dose lasix given.  D/w [facility] who have evaluated patient and will admit patient.  They will move him to [facility].     Date: 10/29/2012  Rate: 71  Rhythm: normal sinus rhythm  QRS Axis: normal  Intervals: PR prolonged  ST/T Wave abnormalities: nonspecific ST/T changes  Conduction Disutrbances:first-degree A-V block   Narrative Interpretation: ST depressions with T-wave inversions V5 and V6. ST depressions 2, 3 and aVF.  Old EKG Reviewed: changes noted    Glynn Octave, MD 10/29/12 1531

## 2012-10-29 NOTE — H&P (Signed)
[facility]    Logan Mclean is an 72 y.o. male.    Primary Cardiologist:Dr. Nilda Riggs, MD  Chief Complaint: Awoke at 0430 with acute SOB  HPI:  72 year old white married male, retired International aid/development worker , presents to [place] for a long emergency room early this morning with 2 to three-day history of shortness of breath, previously with exertion that progressed until he was awakened from sleep this morning with acute shortness of breath. He stated he's felt fluid in his chest for the past several days and having a difficult time lying flat.   He has had allergy symptoms he has allergies to trees.  Normally is able to lay flat. Denies any history of CHF but endorses a history of hypertrophic cardiomyopathy. He has CAD with stents. Denied any chest pain, nausea, vomiting, diaphoresis. He has a cough productive of clear mucus. Denied any leg pain or swelling. Denied taking any diuretics at home. His cardiologist is Dr. Alanda Amass.  Cardiac Hx:  IHSS, Prior LAD stenting in proximal, mid and distal vessels with IVIS interrogation 2005 for essentially single vessel disease and no restenosis on restudy 2006. He had normal nondominant circumflex and normal RCA at that time.  His last Myoview was January 2013 without ischemia. Last echo was March of 2014 with moderate to severe AF H. type hypertrophy and systolic function was normal EF was 55-65. Wall motion was normal and a grade 2 pseudonormalization diastolic dysfunction. Had mild aortic regurgitation and sclerosis without stenosis, moderate mitral regurgitation and mild tricuspid regurgitation with prolapse of the posterior tricuspid leaflet seen on some views. Right ventricle cavity size was normal with moderate to severe RVH, ventricular septum with thickness that was moderately to severely increased.    Here in the emergency room EKG sinus rhythm first degree AV block PR 224 ms with LVH.  Similar to EKG from  our office.  Troponin is negative, proBNP 2400, and d-dimer 0.27.  Currently he states he feels better is able to breathe more freely now.  Initial oxygen saturation was 92% on room air.  He has a history of prostate cancer last year and underwent 40 radiation treatments.    Past Medical History  Diagnosis Date  . Coronary artery disease     Stents  . Hemorrhoids   . IHSS (idiopathic hypertrophic subaortic stenosis)   . Prostate cancer 02/19/12    Adenocarcinoa,3+4=7,(PSA=8.0 on 11/11/11  . BPH (benign prostatic hyperplasia)   . Obstructive uropathy   . Cardiomyopathy   . PSA elevation 2011    5.4   . H/O cardiac catheterization     several with stents  . Heart murmur   . Acute CHF 10/29/2012    Past Surgical History  Procedure Laterality Date  . Hernia repair      b/l inguinal  . Colonoscopy    . Prostate biopsy  02/19/2012    Procedure: PROSTATE BIOPSY;  Surgeon: Ky Barban, MD;  Location: [facility];  Service: Urology;  Laterality: N/A;  . Band hemorrhoidectomy      Family History  Problem Relation Age of Onset  . Colon cancer Mother   . Colon cancer Sister   . Brain cancer Daughter 86    deceased   Social History:  reports that he has never smoked. His smokeless tobacco use includes Chew. He reports that  drinks alcohol. He reports that he does not use illicit drugs. he is married.   Allergies:  Allergies  Allergen Reactions  . Compazine (Prochlorperazine) Hives    Outpatient medications: Plavix 75 mg daily Aspirin 81 mg daily Metoprolol tartrate 37.5 mg daily Tamsulosin 0.4 mg daily Simvastatin 80 mg daily Fish oil cap once daily    Results for orders placed during the hospital encounter of 10/29/12 (from the past 48 hour(s))  PRO B NATRIURETIC PEPTIDE     Status: Abnormal   Collection Time    10/29/12  7:02 AM      Result Value Range   Pro B Natriuretic peptide (BNP) 2400.0 (*) 0 - 125 pg/mL  CBC WITH DIFFERENTIAL     Status: Abnormal    Collection Time    10/29/12  7:51 AM      Result Value Range   WBC 8.1  4.0 - 10.5 K/uL   RBC 4.11 (*) 4.22 - 5.81 MIL/uL   Hemoglobin 13.3  13.0 - 17.0 g/dL   HCT 09.6 (*) 04.5 - 40.9 %   MCV 88.3  78.0 - 100.0 fL   MCH 32.4  26.0 - 34.0 pg   MCHC 36.6 (*) 30.0 - 36.0 g/dL   Comment: PRE-WARMING TECHNIQUE USED   RDW 12.3  11.5 - 15.5 %   Platelets 134 (*) 150 - 400 K/uL   Neutrophils Relative % 75  43 - 77 %   Neutro Abs 6.0  1.7 - 7.7 K/uL   Lymphocytes Relative 15  12 - 46 %   Lymphs Abs 1.2  0.7 - 4.0 K/uL   Monocytes Relative 9  3 - 12 %   Monocytes Absolute 0.8  0.1 - 1.0 K/uL   Eosinophils Relative 1  0 - 5 %   Eosinophils Absolute 0.1  0.0 - 0.7 K/uL   Basophils Relative 0  0 - 1 %   Basophils Absolute 0.0  0.0 - 0.1 K/uL  COMPREHENSIVE METABOLIC PANEL     Status: Abnormal   Collection Time    10/29/12  7:51 AM      Result Value Range   Sodium 133 (*) 135 - 145 mEq/L   Potassium 3.9  3.5 - 5.1 mEq/L   Chloride 100  96 - 112 mEq/L   CO2 24  19 - 32 mEq/L   Glucose, Bld 105 (*) 70 - 99 mg/dL   BUN 17  6 - 23 mg/dL   Creatinine, Ser 8.11  0.50 - 1.35 mg/dL   Calcium 8.5  8.4 - 91.4 mg/dL   Total Protein 6.9  6.0 - 8.3 g/dL   Albumin 3.7  3.5 - 5.2 g/dL   AST 26  0 - 37 U/L   ALT 24  0 - 53 U/L   Alkaline Phosphatase 75  39 - 117 U/L   Total Bilirubin 1.5 (*) 0.3 - 1.2 mg/dL   GFR calc non Af Amer 69 (*) >90 mL/min   GFR calc Af Amer 80 (*) >90 mL/min   Comment:            The eGFR has been calculated     using the CKD EPI equation.     This calculation has not been     validated in all clinical     situations.     eGFR's persistently     <90 mL/min signify     possible Chronic Kidney Disease.  TROPONIN I     Status: None   Collection Time    10/29/12  7:51 AM      Result Value Range   Troponin  I <0.30  <0.30 ng/mL   Comment:            Due to the release kinetics of cTnI,     a negative result within the first hours     of the onset of symptoms does  not rule out     myocardial infarction with certainty.     If myocardial infarction is still suspected,     repeat the test at appropriate intervals.  D-DIMER, QUANTITATIVE     Status: None   Collection Time    10/29/12  7:51 AM      Result Value Range   D-Dimer, Quant 0.27  0.00 - 0.48 ug/mL-FEU   Comment:            AT THE INHOUSE ESTABLISHED CUTOFF     VALUE OF 0.48 ug/mL FEU,     THIS ASSAY HAS BEEN DOCUMENTED     IN THE LITERATURE TO HAVE     A SENSITIVITY AND NEGATIVE     PREDICTIVE VALUE OF AT LEAST     98 TO 99%.  THE TEST RESULT     SHOULD BE CORRELATED WITH     AN ASSESSMENT OF THE CLINICAL     PROBABILITY OF DVT / VTE.   Dg Chest 2 View  10/29/2012   *RADIOLOGY REPORT*  Clinical Data: Shortness of breath.  Cough and congestion.  History of hypertension.  CHEST - 2 VIEW  Comparison: 03/19/2005 and 05/10/2004.  Findings: The heart size and mediastinal contours are stable. There is progressive diffuse interstitial prominence with central airway thickening and septal lines suspicious for mild edema.  No confluent airspace opacity or significant pleural effusion is seen. The osseous structures appear unremarkable.  IMPRESSION: New diffuse interstitial prominence suspicious for interstitial pulmonary edema.   Original Report Authenticated By: Carey Bullocks, M.D.    ROS: General:no colds or fevers, + allergies, no weight changes Skin:no rashes or ulcers HEENT:no blurred vision, mild congestion CV:see HPI PUL:see HPI GI:no diarrhea constipation or melena, no indigestion GU:no hematuria, no dysuria MS:no joint pain, no claudication Neuro:no syncope, no lightheadedness (previous hx of syncope-vasovagel) Endo:no diabetes, no thyroid disease   Blood pressure 140/67, pulse 81, temperature 98.6 F (37 C), temperature source Oral, resp. rate 18, height 5\' 6"  (1.676 m), weight 171 lb (77.565 kg), SpO2 92.00%. PE: General:Pleasant affect, NAD Skin:Warm and dry, brisk capillary  refill HEENT:normocephalic, sclera clear, mucus membranes moist Neck:supple, no JVD, no bruits  Heart:S1S2 RRR with 3/6 systolic murmur murmur, no gallup, rub or click Lungs:with few rales, diminished breath sosunds, no rhonchi, or wheezes ZOX:WRUE, non tender, + BS, do not palpate liver spleen or masses Ext: no lower ext edema, 2+ pedal pulses, 2+ radial pulses Neuro:alert and oriented, MAE, follows commands, + facial symmetry    Assessment/Plan Principal Problem:   Acute CHF Active Problems:   Prostate cancer   CAD (coronary artery disease), hx of stents X 3 to the LAD in 2005   IHSS (idiopathic hypertrophic subaortic stenosis)   Heart murmur  PLAN: Patient received 40 mg IV Lasix in the emergency room and he does feel better.  Initial troponin negative. Plan to admit For continued diuresing and to rule out myocardial infarction, possibly ischemic mediated heart failure.    [facility] R Nurse Practitioner Certified [facility] Pager [phone] 10/29/2012, 11:58 AM

## 2012-10-29 NOTE — H&P (Signed)
Pt. Seen and examined. Agree with the NP/PA-C note as written.  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 He reports several days of progressive dyspnea and orthopnea. Initial BNP was elevated at ~2400, CXR shows diffuse interstitial prominence suspicious for pulmonary edema. He feels somewhat better with diuretics. Exam as per Nada Boozer, NP, with notable LVOT murmur which is provokable with afterload maneuvers as well as MR, lungs with crackles 1/2 way up on the right side and at the base on the left. Mild orthopnea, on nasal cannula. Will change b-blocker to metoprolol XL 25 mg daily (since he is taking metoprolol tartrate only daily now anyway). He needs closer monitoring of CHF due to his HOCM, possible preload dependence. Would recommend transfer to [facility] for admission to cardiac SDU.  Chrystie Nose, MD, [facility] Attending Cardiologist [facility]

## 2012-10-29 NOTE — Progress Notes (Signed)
Utilization Review Completed.   Verland Sprinkle, RN, BSN Nurse Case Manager  [phone]  

## 2012-10-30 DIAGNOSIS — I509 Heart failure, unspecified: Secondary | ICD-10-CM

## 2012-10-30 DIAGNOSIS — I2510 Atherosclerotic heart disease of native coronary artery without angina pectoris: Secondary | ICD-10-CM

## 2012-10-30 LAB — CBC
MCHC: 35.5 g/dL (ref 30.0–36.0)
Platelets: 136 10*3/uL — ABNORMAL LOW (ref 150–400)
RDW: 12.5 % (ref 11.5–15.5)
WBC: 7.3 10*3/uL (ref 4.0–10.5)

## 2012-10-30 LAB — BASIC METABOLIC PANEL
BUN: 27 mg/dL — ABNORMAL HIGH (ref 6–23)
Calcium: 8.2 mg/dL — ABNORMAL LOW (ref 8.4–10.5)
Creatinine, Ser: 1.44 mg/dL — ABNORMAL HIGH (ref 0.50–1.35)
GFR calc Af Amer: 55 mL/min — ABNORMAL LOW (ref >90)

## 2012-10-30 LAB — TROPONIN I: Troponin I: <0.3 ng/mL (ref <0.30)

## 2012-10-30 MED ORDER — VERAPAMIL HCL 40 MG PO TABS
40.0000 mg | ORAL_TABLET | Freq: Three times a day (TID) | ORAL | Status: DC
  Administered 2012-10-30 – 2012-11-01 (×7): 40 mg via ORAL
  Filled 2012-10-30 (×9): qty 1

## 2012-10-30 MED ORDER — FUROSEMIDE 20 MG PO TABS
20.0000 mg | ORAL_TABLET | Freq: Every day | ORAL | Status: DC
  Administered 2012-10-30: 20 mg via ORAL
  Filled 2012-10-30 (×2): qty 1

## 2012-10-30 MED ORDER — ENOXAPARIN SODIUM 40 MG/0.4ML ~~LOC~~ SOLN
40.0000 mg | SUBCUTANEOUS | Status: DC
  Administered 2012-10-31 – 2012-11-01 (×2): 40 mg via SUBCUTANEOUS
  Filled 2012-10-30 (×3): qty 0.4

## 2012-10-30 NOTE — Progress Notes (Addendum)
[facility]   Subjective: No complaints, feels much better, just finished lunch.  Objective: Vital signs in last 24 hours: Temp:  [97.5 F (36.4 C)-99.2 F (37.3 C)] 98 F (36.7 C) (05/24 0525) Pulse Rate:  [62-78] 65 (05/24 0949) Resp:  [18-20] 18 (05/24 0525) BP: (83-117)/(46-66) 113/52 mmHg (05/24 0949) SpO2:  [96 %-98 %] 96 % (05/24 0525) Weight:  [169 lb 1.5 oz (76.7 kg)-170 lb 11.2 oz (77.429 kg)] 169 lb 1.5 oz (76.7 kg) (05/24 0525) Weight change: -4.8 oz (-0.136 kg) Last BM Date: 10/29/12 Intake/Output from previous day: -1135,  Wt to 169 down from 171 on admit. 05/23 0701 - 05/24 0700 In: 840 [P.O.:840] Out: 1975 [Urine:1975] Intake/Output this shift: Total I/O In: 120 [P.O.:120] Out: 475 [Urine:475]  PE: General:Pleasant affect, NAD Neck:supple, no JVD, no bruits  Heart:S1S2 RRR 2-3/6 systolic murmur, no gallup, rub or click Lungs:diminished without rales, rhonchi, or wheezes ZOX:WRUE, non tender, + BS, do not palpate liver spleen or masses Ext:no lower ext edema, 2+ pedal pulses, 2+ radial pulses Neuro:alert and oriented, MAE, follows commands, + facial symmetry   EKG:  SR incomplete LBBB, LVH Lab Results:  Recent Labs  10/29/12 1525 10/30/12 0305  WBC 6.5 7.3  HGB 12.0* 10.9*  HCT 33.2* 30.7*  PLT 134* 136*   BMET  Recent Labs  10/29/12 0751 10/29/12 1525 10/30/12 0305  NA 133*  --  133*  K 3.9  --  3.6  CL 100  --  97  CO2 24  --  25  GLUCOSE 105*  --  109*  BUN 17  --  27*  CREATININE 1.05 1.18 1.44*  CALCIUM 8.5  --  8.2*    Recent Labs  10/29/12 2108 10/30/12 0305  TROPONINI <0.30 <0.30      Lab Results  Component Value Date   TSH 2.416 10/29/2012    Hepatic Function Panel  Recent Labs  10/29/12 0751  PROT 6.9  ALBUMIN 3.7  AST 26  ALT 24  ALKPHOS 75  BILITOT 1.5*   BNP (last 3 results)  Recent Labs  10/29/12 0702 10/30/12 0305  PROBNP 2400.0* 2735.0*     Studies/Results: Dg  Chest 2 View  10/29/2012   *RADIOLOGY REPORT*  Clinical Data: Shortness of breath.  Cough and congestion.  History of hypertension.  CHEST - 2 VIEW  Comparison: 03/19/2005 and 05/10/2004.  Findings: The heart size and mediastinal contours are stable. There is progressive diffuse interstitial prominence with central airway thickening and septal lines suspicious for mild edema.  No confluent airspace opacity or significant pleural effusion is seen. The osseous structures appear unremarkable.  IMPRESSION: New diffuse interstitial prominence suspicious for interstitial pulmonary edema.   Original Report Authenticated By: Carey Bullocks, M.D.   08/26/12:  Echo Study Conclusions  - Left ventricle: The cavity size was mildly reduced. There was moderate concentric hypertrophy. Systolic function was normal. The estimated ejection fraction was in the range of 55% to 65%. Wall motion was normal; there were no regional wall motion abnormalities. Doppler parameters are consistent with abnormal left ventricular relaxation (grade 1 diastolic dysfunction). - Ventricular septum: Thickness was moderately increased. - Aortic valve: Mild regurgitation. - Mitral valve: There was systolic anterior motion. Moderate regurgitation. Valve area by pressure half-time: 2.12cm^2. - Atrial septum: No defect or patent foramen ovale was identified. - Tricuspid valve: Prolapse. Impressions:  - IHSS with mitral valve SAM and 41 mmHg. outflow tract gradient with [place]. Improved from 65  mmHg. on last studyof 06/2011. NJo siognificant intra-ventricular dyssynchrony  Medications: I have reviewed the patient's current medications. Marland Kitchen aspirin  81 mg Oral Daily  . atorvastatin  40 mg Oral q1800  . clopidogrel  75 mg Oral QAC breakfast  . enoxaparin (LOVENOX) injection  1 mg/kg Subcutaneous Q12H  . furosemide  40 mg Intravenous Q12H  . lisinopril  2.5 mg Oral Daily  . metoprolol succinate  25 mg Oral Daily  . omega-3 acid  ethyl esters  1 g Oral Daily  . potassium chloride  20 mEq Oral BID  . sodium chloride  3 mL Intravenous Q12H  . tamsulosin  0.4 mg Oral Daily   Assessment/Plan: Principal Problem:   Acute CHF Active Problems:   Prostate cancer   CAD (coronary artery disease), hx of stents X 3 to the LAD in 2005   IHSS (idiopathic hypertrophic subaortic stenosis)   Heart murmur  PLAN: Pro BNP increased slightly.  Negative MI--last myoview 2013 negative, serum Cr. Increased slightly. Drop in H/H will change to VTE prophylaxis for lovenox. Ambulate, ? Keep another day for IV lasix or change to po BP decreased with diuretics and we had added lisinopril.    LOS: 1 day   Time spent with pt. :20 minutes. [facility] R  Nurse Practitioner Certified Pager [phone] 10/30/2012, 12:17 PM     Patient seen and examined. Agree with assessment and plan. I/O -1490 since admission. BNP increased to 2735. Nl LV systolic function with Grade I diastolic dysfunction and HOCM with 41 mm hg LVOT gradient on March echo. Will change to oral diuresis. May benefit from verapamil for additional diastolic relaxation.; avoid over diuresis with HOCM.  Consider outpatient sleep study; with symptoms noted probably during REM sleep in early am. Pt admits to significant snoring; concern for nocturnal desaturation.   Lennette Bihari, MD, [facility] 10/30/2012 1:02 PM

## 2012-10-31 ENCOUNTER — Inpatient Hospital Stay (HOSPITAL_COMMUNITY): Payer: Medicare Other

## 2012-10-31 ENCOUNTER — Encounter (HOSPITAL_COMMUNITY): Payer: Self-pay | Admitting: *Deleted

## 2012-10-31 DIAGNOSIS — R011 Cardiac murmur, unspecified: Secondary | ICD-10-CM

## 2012-10-31 LAB — CBC
HCT: 31.8 % — ABNORMAL LOW (ref 39.0–52.0)
Hemoglobin: 11.2 g/dL — ABNORMAL LOW (ref 13.0–17.0)
MCH: 31.3 pg (ref 26.0–34.0)
MCV: 88.8 fL (ref 78.0–100.0)
Platelets: 151 10*3/uL (ref 150–400)
RBC: 3.58 MIL/uL — ABNORMAL LOW (ref 4.22–5.81)
WBC: 8.4 10*3/uL (ref 4.0–10.5)

## 2012-10-31 LAB — BASIC METABOLIC PANEL
CO2: 22 mEq/L (ref 19–32)
Chloride: 97 mEq/L (ref 96–112)
Glucose, Bld: 111 mg/dL — ABNORMAL HIGH (ref 70–99)
Potassium: 4.1 mEq/L (ref 3.5–5.1)
Sodium: 131 mEq/L — ABNORMAL LOW (ref 135–145)

## 2012-10-31 MED ORDER — POTASSIUM CHLORIDE CRYS ER 20 MEQ PO TBCR
20.0000 meq | EXTENDED_RELEASE_TABLET | Freq: Every day | ORAL | Status: DC
  Administered 2012-10-31 – 2012-11-01 (×2): 20 meq via ORAL
  Filled 2012-10-31: qty 1

## 2012-10-31 MED ORDER — FUROSEMIDE 10 MG/ML IJ SOLN
40.0000 mg | INTRAMUSCULAR | Status: AC
  Administered 2012-10-31: 40 mg via INTRAVENOUS

## 2012-10-31 NOTE — Progress Notes (Signed)
Pt. Seen and examined. Agree with the NP/PA-C note as written.  Slight decompensation in breathing overnight. Exam with persistent basilar crackles today, BNP is down - I think he needs additional lasix today. Still has nasal cannula O2 requirement. Sitting upright, coughing with arms elevated. Check CXR today. Lasix 40 mg IV x 1. BP improved now on verapamil. Not ready for discharge.  Chrystie Nose, MD, [facility] Attending Cardiologist [facility]

## 2012-10-31 NOTE — Progress Notes (Signed)
When rounding on patient, he stated that he was feeling SOB.  SpO2 remained in mid 90's on room air, applied O2 at 2L via nasal cannula for comfort.  Upon re-assessment, patient resting comfortably with no further complaints. Will continue to monitor. Troy Sine

## 2012-10-31 NOTE — Progress Notes (Signed)
[facility]   Subjective: SOB around 3 am improved with oxygen and raising his head, pt concerned this will happen at home.  Objective: Vital signs in last 24 hours: Temp:  [98.1 F (36.7 C)-98.3 F (36.8 C)] 98.1 F (36.7 C) (05/25 0500) Pulse Rate:  [65-83] 83 (05/25 0500) Resp:  [18-20] 20 (05/25 0500) BP: (107-118)/(52-77) 115/70 mmHg (05/25 0500) SpO2:  [92 %-98 %] 92 % (05/25 0500) Weight:  [169 lb 12.1 oz (77 kg)] 169 lb 12.1 oz (77 kg) (05/25 0500) Weight change: -15.1 oz (-0.429 kg) Last BM Date: 10/30/12 Intake/Output from previous day: +265  (-1225 total since admit)  Wt 169 down from 171 05/24 0701 - 05/25 0700 In: 840 [P.O.:840] Out: 575 [Urine:575] Intake/Output this shift: Total I/O In: 120 [P.O.:120] Out: 475 [Urine:475]  PE: General:Pleasant affect, NAD Neck:supple, no JVD, no bruits  Heart:S1S2 RRR with 2-3 systolic murmur, gallup, rub or click Lungs:clear with fine rales in bases, no rhonchi, or wheezes OZH:YQMV, non tender, + BS, do not palpate liver spleen or masses Ext:no lower ext edema   TELE: SR occ PVC  Lab Results:  Recent Labs  10/30/12 0305 10/31/12 0332  WBC 7.3 8.4  HGB 10.9* 11.2*  HCT 30.7* 31.8*  PLT 136* 151   BMET  Recent Labs  10/30/12 0305 10/31/12 0332  NA 133* 131*  K 3.6 4.1  CL 97 97  CO2 25 22  GLUCOSE 109* 111*  BUN 27* 32*  CREATININE 1.44* 1.33  CALCIUM 8.2* 8.4    Recent Labs  10/29/12 2108 10/30/12 0305  TROPONINI <0.30 <0.30      Lab Results  Component Value Date   TSH 2.416 10/29/2012    Hepatic Function Panel  Recent Labs  10/29/12 0751  PROT 6.9  ALBUMIN 3.7  AST 26  ALT 24  ALKPHOS 75  BILITOT 1.5*     Studies/Results: No results found.  Medications: I have reviewed the patient's current medications. Scheduled Meds: . aspirin  81 mg Oral Daily  . atorvastatin  40 mg Oral q1800  . clopidogrel  75 mg Oral QAC breakfast  . enoxaparin (LOVENOX)  injection  40 mg Subcutaneous Q24H  . furosemide  20 mg Oral Daily  . lisinopril  2.5 mg Oral Daily  . metoprolol succinate  25 mg Oral Daily  . omega-3 acid ethyl esters  1 g Oral Daily  . potassium chloride  20 mEq Oral BID  . sodium chloride  3 mL Intravenous Q12H  . tamsulosin  0.4 mg Oral Daily  . verapamil  40 mg Oral Q8H   Continuous Infusions:  PRN Meds:.sodium chloride, acetaminophen, ALPRAZolam, ondansetron (ZOFRAN) IV, sodium chloride, zolpidem  Assessment/Plan: Principal Problem:   Acute CHF Active Problems:   Prostate cancer   CAD (coronary artery disease), hx of stents X 3 to the LAD in 2005   IHSS (idiopathic hypertrophic subaortic stenosis)   Heart murmur  PLAN:  SOB during the night.  Lasix decreased yesterday and verapamil added.  BP stable. ? Dose of IV this AM then chang po to 20 BID?  May need one more day to fine tune his meds.   LOS: 2 days   Time spent with pt. :15 minutes. [facility] R  Nurse Practitioner Certified Pager [phone] 10/31/2012, 8:47 AM

## 2012-11-01 DIAGNOSIS — I509 Heart failure, unspecified: Secondary | ICD-10-CM

## 2012-11-01 LAB — PRO B NATRIURETIC PEPTIDE: Pro B Natriuretic peptide (BNP): 950.1 pg/mL — ABNORMAL HIGH (ref 0–125)

## 2012-11-01 LAB — BASIC METABOLIC PANEL
BUN: 25 mg/dL — ABNORMAL HIGH (ref 6–23)
CO2: 25 mEq/L (ref 19–32)
Chloride: 98 mEq/L (ref 96–112)
Creatinine, Ser: 1.17 mg/dL (ref 0.50–1.35)
GFR calc Af Amer: 70 mL/min — ABNORMAL LOW (ref >90)
Potassium: 4.5 mEq/L (ref 3.5–5.1)

## 2012-11-01 MED ORDER — FUROSEMIDE 40 MG PO TABS
40.0000 mg | ORAL_TABLET | Freq: Every day | ORAL | Status: DC
  Administered 2012-11-01: 40 mg via ORAL
  Filled 2012-11-01 (×2): qty 1

## 2012-11-01 MED ORDER — ATORVASTATIN CALCIUM 40 MG PO TABS: 40.0000 mg | ORAL_TABLET | Freq: Every day | ORAL | Status: DC

## 2012-11-01 MED ORDER — VERAPAMIL HCL 40 MG PO TABS: 40.0000 mg | ORAL_TABLET | Freq: Three times a day (TID) | ORAL | Status: DC

## 2012-11-01 MED ORDER — LISINOPRIL 2.5 MG PO TABS: 2.5000 mg | ORAL_TABLET | Freq: Every day | ORAL | Status: DC

## 2012-11-01 MED ORDER — POTASSIUM CHLORIDE CRYS ER 20 MEQ PO TBCR: 20.0000 meq | EXTENDED_RELEASE_TABLET | Freq: Every day | ORAL | Status: DC

## 2012-11-01 MED ORDER — FUROSEMIDE 40 MG PO TABS: 20.0000 mg | ORAL_TABLET | Freq: Two times a day (BID) | ORAL | Status: DC

## 2012-11-01 MED ORDER — METOPROLOL SUCCINATE ER 25 MG PO TB24: 25.0000 mg | ORAL_TABLET | Freq: Every day | ORAL | Status: DC

## 2012-11-01 NOTE — Discharge Summary (Signed)
Physician Discharge Summary   [facility]   Patient ID: Logan Mclean MRN: [id number] DOB/AGE: Nov 19, 1940 72 y.o.  Admit date: 10/29/2012 Discharge date: 11/01/2012  Discharge Diagnoses:  Principal Problem:   Acute diastolic heart failure Active Problems:   Prostate cancer   CAD (coronary artery disease), hx of stents X 3 to the LAD in 2005, negative MI   IHSS (idiopathic hypertrophic subaortic stenosis)   Heart murmur, moderate MR   Discharged Condition: good  Procedures: none  Hospital Course: 72 year old white married male, retired International aid/development worker , presented to [place] for a long emergency room early this morning with 2 to three-day history of shortness of breath, previously with exertion that progressed until he was awakened from sleep this morning with acute shortness of breath.  He stated he's felt fluid in his chest for the past several days and having a difficult time lying flat. He has had allergy symptoms he has allergies to trees. Normally is able to lay flat. Denies any history of CHF but endorses a history of hypertrophic cardiomyopathy. He has CAD with stents. Denied any chest pain, nausea, vomiting, diaphoresis. He has a cough productive of clear mucus. Denied any leg pain or swelling. Denied taking any diuretics at home. His cardiologist is Dr. Alanda Amass.   Cardiac Hx: IHSS, Prior LAD stenting in proximal, mid and distal vessels with IVIS interrogation 2005 for essentially single vessel disease and no restenosis on restudy 2006. He had normal nondominant circumflex and normal RCA at that time. His last Myoview was January 2013 without ischemia. Last echo was March of 2014 with moderate to severe AF H. type hypertrophy and systolic function was normal EF was 55-65. Wall motion was normal and a grade 2 pseudonormalization diastolic dysfunction. Had mild aortic regurgitation and sclerosis without stenosis, moderate mitral regurgitation and mild tricuspid  regurgitation with prolapse of the posterior tricuspid leaflet seen on some views. Right ventricle cavity size was normal with moderate to severe RVH, ventricular septum with thickness that was moderately to severely increased.   Pt admitted and transferred to [facility] to HF unit.  IV Lasix given in ER with improvement of his symptoms.  Continued IV diuretics, meds adjusted.  Cardiac enzymes were negative.  Lasix was changed to po but he developed SOB during the night.  IV lasix again given.  By the AM of 11/01/12 he was stable without SOB.  His meds were adjusted with addition of Lasix 20 BID, K+, Verapamil, Ace I, and Lopressor changed to Toprol. Pt will follow up with Dr. Alanda Amass within a week.      Consults: None  Significant Diagnostic Studies:  BMET    Component Value Date/Time   NA 133* 11/01/2012 0455   K 4.5 11/01/2012 0455   CL 98 11/01/2012 0455   CO2 25 11/01/2012 0455   GLUCOSE 107* 11/01/2012 0455   BUN 25* 11/01/2012 0455   CREATININE 1.17 11/01/2012 0455   CALCIUM 8.5 11/01/2012 0455   GFRNONAA 60* 11/01/2012 0455   GFRAA 70* 11/01/2012 0455    CBC    Component Value Date/Time   WBC 8.4 10/31/2012 0332   RBC 3.58* 10/31/2012 0332   HGB 11.2* 10/31/2012 0332   HCT 31.8* 10/31/2012 0332   PLT 151 10/31/2012 0332   MCV 88.8 10/31/2012 0332   MCH 31.3 10/31/2012 0332   MCHC 35.2 10/31/2012 0332   RDW 12.2 10/31/2012 0332   LYMPHSABS 1.2 10/29/2012 0751   MONOABS 0.8 10/29/2012 0751  EOSABS 0.1 10/29/2012 0751   BASOSABS 0.0 10/29/2012 0751   Patient Active Problem List   Diagnosis Date Noted  . Acute diastolic heart failure 10/29/2012  . CAD (coronary artery disease), hx of stents X 3 to the LAD in 2005, negative MI 10/29/2012  . IHSS (idiopathic hypertrophic subaortic stenosis)   . Heart murmur, moderate MR   . Prostate cancer 02/19/2012   BNP (last 3 results)  Recent Labs  10/30/12 0305 10/31/12 0332 11/01/12 0455  PROBNP 2735.0* 1259.0* 950.1*   Troponin  <0.30 X 3  D  Dimer 0.27 TSH 2.416   10/29/12: Clinical Data: Shortness of breath. Cough and congestion. History of hypertension.  CHEST - 2 VIEW  Comparison: 03/19/2005 and 05/10/2004.  Findings: The heart size and mediastinal contours are stable. There is progressive diffuse interstitial prominence with central airway thickening and septal lines suspicious for mild edema. No confluent airspace opacity or significant pleural effusion is seen. The osseous structures appear unremarkable.  IMPRESSION: New diffuse interstitial prominence suspicious for interstitial pulmonary edema.   10/31/12-though this was done prior to follow up dose of IV Lasix:  PORTABLE CHEST - 1 VIEW  Comparison: 10/29/2012; 05/10/2004  Findings:  Apparent enlargement of the cardiac silhouette is favored to be secondary to decreased lung volumes and projection. Unchanged mediastinal contours. The pulmonary vasculature remains indistinct with cephalization of flow. Worsening bibasilar heterogeneous opacities, left greater than right. No definite pleural effusion, though note, the bilateral costophrenic angles are excluded from view. No pneumothorax. Unchanged bones.  IMPRESSION: 1. Grossly unchanged findings of pulmonary edema. 2. Decreased lung volumes with worsening bibasilar opacities, left greater than right, atelectasis versus infiltrate.     Discharge Exam: Blood pressure 132/75, pulse 65, temperature 98.1 F (36.7 C), temperature source Oral, resp. rate 18, height 5\' 6"  (1.676 m), weight 168 lb 11.2 oz (76.522 kg), SpO2 96.00%. AM Exam: PE: General:Pleasant affect, NAD  Neck:supple, no JVD, no bruits  Heart:S1S2 RRR 1/6 sem  Lungs:with crackles no rhonchi, or wheezes  RUE:AVWU, non tender, + BS, do not palpate liver spleen or masses  Ext:no lower ext edema, 2+ pedal pulses  Neuro:alert and oriented, MAE, follows commands, + facial symmetry  TELE: SR with PVCs occ  Disposition: 01-Home or Self  Care       Future Appointments Provider Department Dept Phone   01/27/2013 9:00 AM Billie Lade, MD  [facility] [phone]       Medication List    STOP taking these medications       doxycycline 100 MG capsule  Commonly known as:  VIBRAMYCIN     metoprolol tartrate 25 MG tablet  Commonly known as:  LOPRESSOR     simvastatin 80 MG tablet  Commonly known as:  ZOCOR  Replaced by:  atorvastatin 40 MG tablet      TAKE these medications       aspirin 81 MG tablet  Take 81 mg by mouth daily.     atorvastatin 40 MG tablet  Commonly known as:  LIPITOR  Take 1 tablet (40 mg total) by mouth daily at 6 PM.     clopidogrel 75 MG tablet  Commonly known as:  PLAVIX  Take 75 mg by mouth daily.     fish oil-omega-3 fatty acids 1000 MG capsule  Take 1 g by mouth daily.     furosemide 40 MG tablet  Commonly known as:  LASIX  Take 0.5 tablets (20 mg total) by mouth 2 (  two) times daily.     lisinopril 2.5 MG tablet  Commonly known as:  PRINIVIL,ZESTRIL  Take 1 tablet (2.5 mg total) by mouth daily.     metoprolol succinate 25 MG 24 hr tablet  Commonly known as:  TOPROL-XL  Take 1 tablet (25 mg total) by mouth daily.     potassium chloride SA 20 MEQ tablet  Commonly known as:  K-DUR,KLOR-CON  Take 1 tablet (20 mEq total) by mouth daily.     tamsulosin 0.4 MG Caps  Commonly known as:  FLOMAX  Take 0.4 mg by mouth daily.     verapamil 40 MG tablet  Commonly known as:  CALAN  Take 1 tablet (40 mg total) by mouth every 8 (eight) hours.       Follow-up Information   Follow up with Governor Rooks, MD. (Our office will call you to schedule appt.  you should see Dr. Alanda Amass the end of this week or beginning of next.)    Contact information:   [address] [place] [address] [phone]      DISCHARGE INSTRUCTIONS: Weigh daily Call [phone] if weight climbs more than 3 pounds in a day or 5 pounds in a  week. No salt to very little salt in your diet.  No more than 2000 mg in a day. Call if increased shortness of breath or increased swelling.   New medications.  We changed the Zocor to Lipitor to prevent interaction with Zocor and Verapamil.  Lipitor will be ok.  We changed the lopressor to Toprol because it is a long acting medication, only once a day.  Dr. Alanda Amass will adjust Verapamil as outpatient.  Dr. Alanda Amass will decide about the sleep study.   Signed: Leone Brand Nurse Practitioner-Certified [facility] 11/01/2012, 11:09 AM  Time spent on discharge :including AM exam and MD time:  45 minutes.

## 2012-11-01 NOTE — Progress Notes (Signed)
1240 Discharge [nstructions given to pt Verbalized understanding

## 2012-11-01 NOTE — Progress Notes (Signed)
[facility]   Subjective: No complaints, feels better, no SOB, no Chest pain  Objective: Vital signs in last 24 hours: Temp:  [97.9 F (36.6 C)-98.4 F (36.9 C)] 98.1 F (36.7 C) (05/26 0456) Pulse Rate:  [68-78] 78 (05/26 0456) Resp:  [12-20] 20 (05/26 0456) BP: (90-136)/(40-70) 136/70 mmHg (05/26 0456) SpO2:  [94 %-97 %] 95 % (05/26 0456) Weight:  [168 lb 11.2 oz (76.522 kg)] 168 lb 11.2 oz (76.522 kg) (05/26 0456) Weight change: -1 lb 0.9 oz (-0.478 kg) Last BM Date: 10/31/12 Intake/Output from previous day: -455 (total -1325)  Wt 168.11 down from 171 05/25 0701 - 05/26 0700 In: 1320 [P.O.:1320] Out: 1775 [Urine:1775] Intake/Output this shift:    PE: General:Pleasant affect, NAD Neck:supple, no JVD, no bruits  Heart:S1S2 RRR 1/6 sem Lungs:with crackles no rhonchi, or wheezes ZOX:WRUE, non tender, + BS, do not palpate liver spleen or masses Ext:no lower ext edema, 2+ pedal pulses Neuro:alert and oriented, MAE, follows commands, + facial symmetry  TELE: SR with PVCs occ  Lab Results:  Recent Labs  10/30/12 0305 10/31/12 0332  WBC 7.3 8.4  HGB 10.9* 11.2*  HCT 30.7* 31.8*  PLT 136* 151   BMET  Recent Labs  10/31/12 0332 11/01/12 0455  NA 131* 133*  K 4.1 4.5  CL 97 98  CO2 22 25  GLUCOSE 111* 107*  BUN 32* 25*  CREATININE 1.33 1.17  CALCIUM 8.4 8.5    Recent Labs  10/29/12 2108 10/30/12 0305  TROPONINI <0.30 <0.30    No results found for this basename: CHOL, HDL, LDLCALC, LDLDIRECT, TRIG, CHOLHDL   No results found for this basename: HGBA1C     Lab Results  Component Value Date   TSH 2.416 10/29/2012    Hepatic Function Panel  Recent Labs  10/29/12 0751  PROT 6.9  ALBUMIN 3.7  AST 26  ALT 24  ALKPHOS 75  BILITOT 1.5*    Studies/Results: Dg Chest Port 1 View  10/31/2012   *RADIOLOGY REPORT*  Clinical Data: Cough, dyspnea, CHF  PORTABLE CHEST - 1 VIEW  Comparison: 10/29/2012; 05/10/2004  Findings:   Apparent enlargement of the cardiac silhouette is favored to be secondary to decreased lung volumes and projection.  Unchanged mediastinal contours.  The pulmonary vasculature remains indistinct with cephalization of flow.  Worsening bibasilar heterogeneous opacities, left greater than right.  No definite pleural effusion, though note, the bilateral costophrenic angles are excluded from view.  No pneumothorax.  Unchanged bones.  IMPRESSION: 1.  Grossly unchanged findings of pulmonary edema. 2.  Decreased lung volumes with worsening bibasilar opacities, left greater than right, atelectasis versus infiltrate.   Original Report Authenticated By: Tacey Ruiz, MD    Medications: I have reviewed the patient's current medications. Scheduled Meds: . aspirin  81 mg Oral Daily  . atorvastatin  40 mg Oral q1800  . clopidogrel  75 mg Oral QAC breakfast  . enoxaparin (LOVENOX) injection  40 mg Subcutaneous Q24H  . lisinopril  2.5 mg Oral Daily  . metoprolol succinate  25 mg Oral Daily  . omega-3 acid ethyl esters  1 g Oral Daily  . potassium chloride  20 mEq Oral Daily  . sodium chloride  3 mL Intravenous Q12H  . tamsulosin  0.4 mg Oral Daily  . verapamil  40 mg Oral Q8H   Continuous Infusions:  PRN Meds:.sodium chloride, acetaminophen, ALPRAZolam, ondansetron (ZOFRAN) IV, sodium chloride, zolpidem  Assessment/Plan: Principal Problem:   Acute CHF Active  Problems:   Prostate cancer   CAD (coronary artery disease), hx of stents X 3 to the LAD in 2005, negative MI   IHSS (idiopathic hypertrophic subaortic stenosis)   Heart murmur  PLAN: CXR without improvement, output good, BP good.  Pro BNP down to 950.  From 2735 at peak. F/U with Dr. Alanda Amass   LOS: 3 days   Time spent with pt. :15 minutes. [facility] R  Nurse Practitioner Certified Pager [phone] 11/01/2012, 7:50 AM   Patient seen and examined. Agree with assessment and plan. Breathing better, wanting to go home today. BNP improved but  still slightly elevated. -1325 since admission.  Will dc today, but give lasix 40 mg orally this am, then give 20 mg bid as outpatient. Titrate verapamil as outpatient as necessary. Consider sleep study evaluation. F/U in 1 week with Dr RAW.   Lennette Bihari, MD, [facility] 11/01/2012 8:31 AM

## 2012-11-03 ENCOUNTER — Other Ambulatory Visit: Payer: Self-pay | Admitting: Cardiovascular Disease

## 2012-11-03 LAB — CBC WITH DIFFERENTIAL/PLATELET
Basophils Absolute: 0.1 10*3/uL (ref 0.0–0.1)
Basophils Relative: 1 % (ref 0–1)
Eosinophils Relative: 4 % (ref 0–5)
Lymphocytes Relative: 37 % (ref 12–46)
MCV: 90.8 fL (ref 78.0–100.0)
Platelets: 263 10*3/uL (ref 150–400)
RDW: 13.2 % (ref 11.5–15.5)
WBC: 6.8 10*3/uL (ref 4.0–10.5)

## 2012-11-04 LAB — COMPREHENSIVE METABOLIC PANEL
ALT: 43 U/L (ref 0–53)
AST: 36 U/L (ref 0–37)
Alkaline Phosphatase: 69 U/L (ref 39–117)
Calcium: 8.8 mg/dL (ref 8.4–10.5)
Chloride: 92 mEq/L — ABNORMAL LOW (ref 96–112)
Creat: 1.12 mg/dL (ref 0.50–1.35)
Total Bilirubin: 0.9 mg/dL (ref 0.3–1.2)

## 2012-11-15 ENCOUNTER — Telehealth: Payer: Self-pay | Admitting: Cardiovascular Disease

## 2012-11-15 NOTE — Telephone Encounter (Signed)
Wants lab results from from about 2wks ago!

## 2012-11-15 NOTE — Telephone Encounter (Signed)
Results printed and given to JC, LPN for Dr. Alanda Amass to review.

## 2012-11-15 NOTE — Telephone Encounter (Signed)
Returned call.  Pt wants to know his results.  Stated he had a PSA done and his other doctor needs to know the result.  Pt informed Dr. Alanda Amass will likely review his results at his appt on Thursday.  Pt verbalized understanding and agreed w/ plan.

## 2012-11-16 ENCOUNTER — Encounter: Payer: Self-pay | Admitting: Cardiovascular Disease

## 2012-11-18 ENCOUNTER — Other Ambulatory Visit: Payer: Self-pay | Admitting: Cardiovascular Disease

## 2012-11-18 LAB — BASIC METABOLIC PANEL
BUN: 14 mg/dL (ref 6–23)
CO2: 27 mEq/L (ref 19–32)
Chloride: 100 mEq/L (ref 96–112)
Glucose, Bld: 84 mg/dL (ref 70–99)
Potassium: 4.1 mEq/L (ref 3.5–5.3)

## 2012-11-29 ENCOUNTER — Encounter: Payer: Self-pay | Admitting: Cardiovascular Disease

## 2013-01-26 ENCOUNTER — Encounter: Payer: Self-pay | Admitting: Radiation Oncology

## 2013-01-26 DIAGNOSIS — Z923 Personal history of irradiation: Secondary | ICD-10-CM | POA: Insufficient documentation

## 2013-01-27 ENCOUNTER — Encounter: Payer: Self-pay | Admitting: Radiation Oncology

## 2013-01-27 ENCOUNTER — Ambulatory Visit
Admission: RE | Admit: 2013-01-27 | Discharge: 2013-01-27 | Disposition: A | Discharge status: Home / Self Care | Payer: Medicare Other | Source: Ambulatory Visit | Attending: Radiation Oncology | Admitting: Radiation Oncology

## 2013-01-27 VITALS — BP 139/59 | HR 62 | Temp 98.3°F | Resp 20 | Wt 164.1 lb

## 2013-01-27 DIAGNOSIS — C61 Malignant neoplasm of prostate: Secondary | ICD-10-CM

## 2013-01-27 NOTE — Progress Notes (Signed)
Pt hospitalized May 2014 for heart failure. PSA drawn 11/03/12 5.37. Pt c/o urinary urgency, frequency, nocturia x 4, voiding small amounts, pressure. He also reports stools from hard to soft w/pressure to have BM but unable. Pt fatigued, no loss of appetite but intentional weight loss due to hospitalization in May.

## 2013-01-27 NOTE — Progress Notes (Signed)
Radiation Oncology         [phone] ________________________________  Name: Logan Mclean MRN: [id number]  Date: 01/27/2013  DOB: Jun 14, 1940  Follow-Up Visit Note  CC: [name], MD  [name], MD  Diagnosis:   Prostate cancer  Interval Since Last Radiation:  7  months  Narrative:  The patient returns today for routine follow-up.  Interval history is significant for the patient being admitted to the hospital for congestive heart failure. He continues to have some fatigue. He does have some urinary symptoms but none above his baseline prior to radiation therapy. He has nocturia x4. Patient denies any hematuria or rectal bleeding. He has mild constipation alternating with loose bowels. He denies any diarrhea. He did have a PSA in May 28 which was down to 5.37. His pretreatment PSA was 8.                              ALLERGIES:  is allergic to compazine.  Meds: Current Outpatient Prescriptions  Medication Sig Dispense Refill  . aspirin 81 MG tablet Take 81 mg by mouth daily.      Marland Kitchen atorvastatin (LIPITOR) 40 MG tablet Take 1 tablet (40 mg total) by mouth daily at 6 PM.  30 tablet  6  . clopidogrel (PLAVIX) 75 MG tablet Take 75 mg by mouth daily.      . fish oil-omega-3 fatty acids 1000 MG capsule Take 1 g by mouth daily.      . furosemide (LASIX) 40 MG tablet Take 0.5 tablets (20 mg total) by mouth 2 (two) times daily.  60 tablet  6  . lisinopril (PRINIVIL,ZESTRIL) 2.5 MG tablet Take 1 tablet (2.5 mg total) by mouth daily.  30 tablet  6  . metoprolol succinate (TOPROL-XL) 25 MG 24 hr tablet Take 1 tablet (25 mg total) by mouth daily.  30 tablet  6  . potassium chloride SA (K-DUR,KLOR-CON) 20 MEQ tablet Take 1 tablet (20 mEq total) by mouth daily.  30 tablet  6  . Tamsulosin HCl (FLOMAX) 0.4 MG CAPS Take 0.4 mg by mouth daily.      . verapamil (CALAN) 40 MG tablet Take 1 tablet (40 mg total) by mouth every 8 (eight) hours.  90 tablet  6  . metoprolol tartrate  (LOPRESSOR) 25 MG tablet        No current facility-administered medications for this encounter.    Physical Findings: The patient is in no acute distress. Patient is alert and oriented.  weight is 164 lb 1.6 oz (74.435 kg). His oral temperature is 98.3 F (36.8 C). His blood pressure is 139/59 and his pulse is 62. His respiration is 20. Marland Kitchen  No palpable supraclavicular adenopathy. The lungs are clear to auscultation. The heart has a regular rhythm and rate. The inguinal areas are free of adenopathy. The testicles are soft without masses. On rectal exam sphincter tone is normal. The prostate is mildly enlarged and firm but no palpable nodules.  Lab Findings: Lab Results  Component Value Date   WBC 6.8 11/03/2012   HGB 12.4* 11/03/2012   HCT 35.7* 11/03/2012   MCV 90.8 11/03/2012   PLT 263 11/03/2012     Impression:  The patient is recovering from the effects of radiation.  The patient's PSA is dropping after radiation therapy  Plan:  Routine followup in 6 months. Patient will have a PSA and possibly urinalysis at that time.  _____________________________________  -----------------------------------  Blair Promise, PhD, MD

## 2013-02-10 ENCOUNTER — Other Ambulatory Visit: Payer: Self-pay | Admitting: *Deleted

## 2013-02-10 MED ORDER — CLOPIDOGREL BISULFATE 75 MG PO TABS: 75.0000 mg | ORAL_TABLET | Freq: Every day | ORAL | Status: DC

## 2013-02-10 NOTE — Telephone Encounter (Signed)
Rx was sent to pharmacy electronically via [employer] 

## 2013-02-23 ENCOUNTER — Other Ambulatory Visit: Payer: Self-pay | Admitting: Cardiovascular Disease

## 2013-02-23 LAB — COMPREHENSIVE METABOLIC PANEL
CO2: 28 mEq/L (ref 19–32)
Calcium: 8.8 mg/dL (ref 8.4–10.5)
Chloride: 99 mEq/L (ref 96–112)
Creat: 1.07 mg/dL (ref 0.50–1.35)
Glucose, Bld: 84 mg/dL (ref 70–99)
Total Bilirubin: 1.2 mg/dL (ref 0.3–1.2)

## 2013-03-03 ENCOUNTER — Encounter: Payer: Self-pay | Admitting: Cardiovascular Disease

## 2013-06-20 ENCOUNTER — Encounter: Payer: Self-pay | Admitting: *Deleted

## 2013-06-24 ENCOUNTER — Ambulatory Visit (INDEPENDENT_AMBULATORY_CARE_PROVIDER_SITE_OTHER): Payer: Medicare Other | Admitting: Internal Medicine

## 2013-06-24 ENCOUNTER — Other Ambulatory Visit: Payer: Self-pay | Admitting: Internal Medicine

## 2013-06-24 ENCOUNTER — Encounter: Payer: Self-pay | Admitting: Internal Medicine

## 2013-06-24 VITALS — BP 120/62 | HR 61 | Ht 66.0 in | Wt 168.5 lb

## 2013-06-24 DIAGNOSIS — I5032 Chronic diastolic (congestive) heart failure: Secondary | ICD-10-CM

## 2013-06-24 DIAGNOSIS — R011 Cardiac murmur, unspecified: Secondary | ICD-10-CM

## 2013-06-24 DIAGNOSIS — E785 Hyperlipidemia, unspecified: Secondary | ICD-10-CM

## 2013-06-24 DIAGNOSIS — Z79899 Other long term (current) drug therapy: Secondary | ICD-10-CM

## 2013-06-24 DIAGNOSIS — I421 Obstructive hypertrophic cardiomyopathy: Secondary | ICD-10-CM

## 2013-06-24 DIAGNOSIS — I2510 Atherosclerotic heart disease of native coronary artery without angina pectoris: Secondary | ICD-10-CM

## 2013-06-24 NOTE — Patient Instructions (Signed)
Your physician recommends that you return for lab work in: a few days to a week. You will need to be fasting - nothing to eat/drink after midnight.   Your physician wants you to follow-up in: 6 month with Dr. Debara Pickett. You will receive a reminder letter in the mail two months in advance. If you don't receive a letter, please call our office to schedule the follow-up appointment.

## 2013-06-24 NOTE — Progress Notes (Signed)
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. He has a history of hypertrophic obstructive cardiomyopathy, dyslipidemia and a history of prostate cancer in the past. He has a history of complex coronary artery disease. In 2005 he underwent cardiac catheterization and under ultimately had drug-eluting stenting x3 of the LAD and was noted to have a dominant right coronary moderate size circumflex artery. His last catheterization was in 2006 and since then has had lower stress test. He also has significant diastolic heart failure.  He was seen by myself in the emergency department last year for acute on chronic diastolic heart failure.  He is stage II diastolic dysfunction on his echo and responded to diuresis. Echo does have suggested that the areas CM and at least a 41 mm outflow tract gradient with valsalva.  This appears to be fairly stable. His weight on discharge from the hospital last year was 168 pounds and is stable today.  He denies any worsening shortness of breath and only reports a small amount of lower extremity swelling.  PMHx:  Past Medical History  Diagnosis Date  . Coronary artery disease     Stents  . Hemorrhoids   . IHSS (idiopathic hypertrophic subaortic stenosis) 2006  . Prostate cancer 02/19/12    Adenocarcinoa,3+4=7,(PSA=8.0 on 11/11/11  . BPH (benign prostatic hyperplasia)   . Obstructive uropathy   . Cardiomyopathy   . PSA elevation 2011    5.4   . H/O cardiac catheterization     several with stents  . Heart murmur   . Acute CHF 10/29/2012  . Family history of anesthesia complication     mother had problems"   . Shortness of breath   . Hx of radiation therapy 04/29/12- 06/28/12    prostate 78 gray in 40 tx  . Hyperlipidemia     Past Surgical History  Procedure  Laterality Date  . Hernia repair Bilateral     inguinal  . Colonoscopy    . Prostate biopsy  02/19/2012    Procedure: PROSTATE BIOPSY;  Surgeon: [name], MD;  Location: [facility];  Service: Urology;  Laterality: N/A;  . Band hemorrhoidectomy    . Transthoracic echocardiogram  08/26/2012    EF 55-65%, mod conc LVH, grade 1 diastolic dysfunction; ventricular septum mod increased; mild AV regurg; mod MR; tricuspid valve prolpase  . Nm myocar perf wall motion  07/03/2011    lexiscan myoview; perfusion defect in inferior myocardium (diaphragmatic attenuation), remaining myocardium with normal perfusion; post-stress EF 56%  . Cardiac catheterization  05/10/2004    CAD with crescendo angina, high-grade prox mid and mid-distal LAD with DES 3.5x35mm Cordis Cypher DES(Dr. [name])   . Cardiac catheterization  03/25/2005    DES X3 (3x5x71mm, 3x49mm, 2.5x52mm) of LAD, single-vessel diease with dominant right and moderate sized Cfx (Dr. [name])     FAMHx:  Family History  Problem Relation Age of Onset  . Colon cancer Mother 21  . Colon cancer Sister   . Brain cancer Daughter 103    deceased, diabetes  . Heart attack Father   . Stroke Maternal Grandmother   . Heart attack Brother 41  . Heart attack Brother     1st MI at 75    SOCHx:   reports that he quit smoking about 30 years ago. He  quit smokeless tobacco use about 28 years ago. His smokeless tobacco use included Chew. He reports that he drinks alcohol. He reports that he does not use illicit drugs.  ALLERGIES:  Allergies  Allergen Reactions  . Compazine [Prochlorperazine] Hives    ROS: A comprehensive review of systems was negative except for: Respiratory: positive for dyspnea on exertion Cardiovascular: positive for lower extremity edema  HOME MEDS: Current Outpatient Prescriptions  Medication Sig Dispense Refill  . aspirin 81 MG tablet Take 81 mg by mouth daily.      Marland Kitchen atorvastatin (LIPITOR) 40 MG tablet Take 1  tablet (40 mg total) by mouth daily at 6 PM.  30 tablet  6  . clopidogrel (PLAVIX) 75 MG tablet Take 1 tablet (75 mg total) by mouth daily.  90 tablet  3  . fish oil-omega-3 fatty acids 1000 MG capsule Take 1 g by mouth daily.      . furosemide (LASIX) 40 MG tablet Take 20 mg by mouth daily. Will take 1 extra 20mg  dose if short of breath      . lisinopril (PRINIVIL,ZESTRIL) 2.5 MG tablet Take 1 tablet (2.5 mg total) by mouth daily.  30 tablet  6  . metoprolol succinate (TOPROL-XL) 25 MG 24 hr tablet Take 1 tablet (25 mg total) by mouth daily.  30 tablet  6  . potassium chloride SA (K-DUR,KLOR-CON) 20 MEQ tablet Take 20 mEq by mouth every other day.      . Tamsulosin HCl (FLOMAX) 0.4 MG CAPS Take 0.4 mg by mouth daily.      . verapamil (VERELAN PM) 120 MG 24 hr capsule Take 120 mg by mouth at bedtime.       No current facility-administered medications for this visit.    LABS/IMAGING: No results found for this or any previous visit (from the past 48 hour(s)). No results found.  VITALS: BP 120/62  Pulse 61  Ht 5\' 6"  (1.676 m)  Wt 168 lb 8 oz (76.431 kg)  BMI 27.21 kg/m2  EXAM: General appearance: alert and no distress Neck: no carotid bruit and no JVD Lungs: clear to auscultation bilaterally Heart: regular rate and rhythm, S1, S2 normal and systolic murmur: late systolic 3/6, decrescendo at apex Abdomen: soft, non-tender; bowel sounds normal; no masses,  no organomegaly Extremities: edema trace bilateral Pulses: 2+ and symmetric Skin: Skin color, texture, turgor normal. No rashes or lesions Neurologic: Grossly normal Psych: Pleasant, normal  EKG: Sinus rhythm with first degree AV block, incomplete left bundle branch block, LVH with repolarization abnormality at 61  ASSESSMENT: 1. Hypertrophic structure cardiomyopathy-41 mmHg gradient with Valsalva 2. Coronary artery disease with DES x3 the LAD 3. Hypertension 4. Dyslipidemia 5. Chronic diastolic heart failure 6. History of  prostate cancer  PLAN: 1.   [name] is actually coming along well on his current dose of diuretics. His weight is staying fairly stable at what was his discharge weight from his last hospitalization. It was thought that some of this is due to dietary indiscretion with salt and has made major changes in his diet. He still has difficulty getting exercise but he has felt a stable weight. He denies any chest pain. He is due for recheck of his cholesterol and renal function. He is followed closely by urologist for his prostate cancer and I plan to see him back in 6 months.  Pixie Casino, MD, [facility] Attending Cardiologist [facility]  HILTY,Kenneth C 06/24/2013, 1:21 PM

## 2013-06-25 LAB — COMPREHENSIVE METABOLIC PANEL
ALT: 20 U/L (ref 0–53)
AST: 20 U/L (ref 0–37)
Albumin: 4 g/dL (ref 3.5–5.2)
Alkaline Phosphatase: 81 U/L (ref 39–117)
BUN: 16 mg/dL (ref 6–23)
CO2: 31 mEq/L (ref 19–32)
Calcium: 8.9 mg/dL (ref 8.4–10.5)
Chloride: 100 mEq/L (ref 96–112)
Creat: 1.08 mg/dL (ref 0.50–1.35)
Glucose, Bld: 81 mg/dL (ref 70–99)
Potassium: 4.6 mEq/L (ref 3.5–5.3)
Sodium: 136 mEq/L (ref 135–145)
Total Bilirubin: 1.1 mg/dL (ref 0.3–1.2)
Total Protein: 6.7 g/dL (ref 6.0–8.3)

## 2013-06-27 ENCOUNTER — Encounter: Payer: Self-pay | Admitting: *Deleted

## 2013-06-27 LAB — NMR LIPOPROFILE WITH LIPIDS
Cholesterol, Total: 127 mg/dL (ref <200)
HDL Particle Number: 27.4 umol/L — ABNORMAL LOW (ref >=30.5)
HDL Size: 8.8 nm — ABNORMAL LOW (ref >=9.2)
HDL-C: 43 mg/dL (ref >=40)
LDL CALC: 68 mg/dL (ref <100)
LDL PARTICLE NUMBER: 1101 nmol/L — AB (ref <1000)
LDL SIZE: 20.8 nm (ref >20.5)
LP-IR SCORE: 40 (ref <=45)
Large HDL-P: 4.3 umol/L — ABNORMAL LOW (ref >=4.8)
Large VLDL-P: 1.1 nmol/L (ref <=2.7)
SMALL LDL PARTICLE NUMBER: 488 nmol/L (ref <=527)
Triglycerides: 82 mg/dL (ref <150)
VLDL SIZE: 41.2 nm (ref <=46.6)

## 2013-07-28 ENCOUNTER — Ambulatory Visit
Admission: RE | Admit: 2013-07-28 | Discharge: 2013-07-28 | Disposition: A | Discharge status: Home / Self Care | Payer: Medicare Other | Source: Ambulatory Visit | Attending: Radiation Oncology | Admitting: Radiation Oncology

## 2013-07-28 ENCOUNTER — Encounter: Payer: Self-pay | Admitting: Radiation Oncology

## 2013-07-28 VITALS — BP 143/67 | HR 60 | Temp 97.8°F | Ht 66.0 in | Wt 170.1 lb

## 2013-07-28 DIAGNOSIS — C61 Malignant neoplasm of prostate: Secondary | ICD-10-CM

## 2013-07-28 DIAGNOSIS — Z79899 Other long term (current) drug therapy: Secondary | ICD-10-CM | POA: Insufficient documentation

## 2013-07-28 DIAGNOSIS — N3944 Nocturnal enuresis: Secondary | ICD-10-CM | POA: Insufficient documentation

## 2013-07-28 DIAGNOSIS — Z7982 Long term (current) use of aspirin: Secondary | ICD-10-CM | POA: Insufficient documentation

## 2013-07-28 LAB — PSA: PSA: 12.87 ng/mL — AB (ref <=4.00)

## 2013-07-28 NOTE — Progress Notes (Signed)
Logan Mclean here for follow up after treatment to his prostate.  He denies pain.  His has urinary frequency and occasional week stream. He is taking lasix.  He reports getting up 3-4 times per night to urinate.  He denies dysuria, diarrhea, hematuria and fatigue.  He reports that he occasionally has the feeling like he has to have a bowel movement but does not go.  He also reports having a dry cough that he has had all winter.

## 2013-07-28 NOTE — Progress Notes (Signed)
  Radiation Oncology         [phone] ________________________________  Name: Logan Mclean MRN: [id number]  Date: 07/28/2013  DOB: 25-Mar-1941  Follow-Up Visit Note  CC: Sherrie Mustache, MD  Annia Belt, MD  Diagnosis:  Prostate cancer  Interval Since Last Radiation:  1 year   Narrative:  The patient returns today for routine follow-up.  He seems to be doing well this time. He has nocturia 3-4 times which is his baseline. Patient denies any hematuria or difficulty with his stream. He denies any rectal bleeding or bowel problems. Last PSA was 5.37 with a pretreatment PSA of 8.Marland Kitchen                           ALLERGIES:  is allergic to compazine.  Meds: Current Outpatient Prescriptions  Medication Sig Dispense Refill  . aspirin 81 MG tablet Take 81 mg by mouth daily.      Marland Kitchen atorvastatin (LIPITOR) 40 MG tablet Take 1 tablet (40 mg total) by mouth daily at 6 PM.  30 tablet  6  . clopidogrel (PLAVIX) 75 MG tablet Take 1 tablet (75 mg total) by mouth daily.  90 tablet  3  . fish oil-omega-3 fatty acids 1000 MG capsule Take 1 g by mouth daily.      . furosemide (LASIX) 40 MG tablet Take 20 mg by mouth daily. Will take 1 extra 20mg  dose if short of breath      . lisinopril (PRINIVIL,ZESTRIL) 2.5 MG tablet Take 1 tablet (2.5 mg total) by mouth daily.  30 tablet  6  . metoprolol succinate (TOPROL-XL) 25 MG 24 hr tablet Take 1 tablet (25 mg total) by mouth daily.  30 tablet  6  . potassium chloride SA (K-DUR,KLOR-CON) 20 MEQ tablet Take 20 mEq by mouth every other day.      . Tamsulosin HCl (FLOMAX) 0.4 MG CAPS Take 0.4 mg by mouth daily.      . verapamil (VERELAN PM) 120 MG 24 hr capsule Take 120 mg by mouth at bedtime.       No current facility-administered medications for this encounter.    Physical Findings: The patient is in no acute distress. Patient is alert and oriented.  height is 5\' 6"  (1.676 m) and weight is 170 lb 1.6 oz (77.157 kg). His temperature is 97.8 F  (36.6 C). His blood pressure is 143/67 and his pulse is 60. Marland Kitchen  No palpable supraclavicular or axillary adenopathy. The lungs are clear to auscultation. The heart has regular rhythm with murmur present as noticed on previous exams the abdomen is soft and nontender with normal bowel sounds.  Lab Findings: Lab Results  Component Value Date   WBC 6.8 11/03/2012   HGB 12.4* 11/03/2012   HCT 35.7* 11/03/2012   MCV 90.8 11/03/2012   PLT 263 11/03/2012      Radiographic Findings: No results found.  Impression: This is doing well at this time  Plan:  PSA and routine followup in 6 months.  ____________________________________ Blair Promise, MD

## 2013-08-10 ENCOUNTER — Telehealth: Payer: Self-pay

## 2013-08-10 MED ORDER — LISINOPRIL 2.5 MG PO TABS: 2.5000 mg | ORAL_TABLET | Freq: Every day | ORAL | Status: DC

## 2013-08-10 NOTE — Telephone Encounter (Signed)
Received refill request from patient's pharmacy for Metoprolol Tartrate. Per Dr Lysbeth Penner last office note (from 06/24/2013) patient reported he was taking Metoprolol Succinate. Call to patient. No answer. LMTCB.

## 2013-08-11 NOTE — Telephone Encounter (Signed)
Returning your call from yesterday. °

## 2013-08-11 NOTE — Telephone Encounter (Signed)
Returned call.  Left message to call back before 4pm.  

## 2013-08-11 NOTE — Telephone Encounter (Signed)
Returned call.  Left message to call back.  PLEASE PAGE CHELLEY OR TRIAGE WHEN PT CALLS BACK.

## 2013-08-11 NOTE — Telephone Encounter (Signed)
Please call asap. He is leaving to go out of town in 10 minutes.

## 2013-08-12 MED ORDER — METOPROLOL TARTRATE 25 MG PO TABS: 25.0000 mg | ORAL_TABLET | Freq: Every day | ORAL | Status: DC

## 2013-08-12 NOTE — Telephone Encounter (Signed)
Returned call to patient-per requested number he left on my voicemail yesterday. Patient is taking Metoprolol Tartrate once daily.  Will send this into the pharmacy electronically.

## 2013-08-22 ENCOUNTER — Other Ambulatory Visit: Payer: Self-pay | Admitting: *Deleted

## 2013-08-22 ENCOUNTER — Telehealth: Payer: Self-pay | Admitting: *Deleted

## 2013-08-22 ENCOUNTER — Encounter: Payer: Self-pay | Admitting: *Deleted

## 2013-08-22 MED ORDER — ATORVASTATIN CALCIUM 40 MG PO TABS: 40.0000 mg | ORAL_TABLET | Freq: Every day | ORAL | Status: DC

## 2013-08-22 NOTE — Telephone Encounter (Signed)
Rx was sent to pharmacy electronically. 

## 2013-08-22 NOTE — Telephone Encounter (Signed)
Janett Billow from [facility] wanted to know if it was ok to work on Logan Mclean. He is there right now needing to have dental work done and he is on Plavix. He apparently had blood clots in his gums so he stopped taking the plavix. She would like for someone to call her back.   Christine

## 2013-08-22 NOTE — Telephone Encounter (Signed)
Probably ok to stop plavix for dental work. His stents were in 2006. He is a retired Animal nutritionist. Would have to stop plavix ideally 5 days before any procedure.  Dr. Debara Pickett

## 2013-08-22 NOTE — Telephone Encounter (Signed)
Returned call to Janett Billow - provided her with info per Dr. Debara Pickett - will fax letter to her attention at [phone]

## 2013-08-22 NOTE — Telephone Encounter (Signed)
Returned call to Janett Billow at [facility]. Patient was seen for 3 dental cleanings. During 1st visit, they noticed he was bleeding more than usual (patient is on Plavix). She reported that at 2nd visit she stated he had stopped Plavix for 1 day prior to the visit, but they noticed clots in his gums during the cleaning. During 3rd visit (TODAY 08/22/13) he stated he stopped Plavix for 2 days prior. 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 He also informed Janett Billow that he will not tell his docotors which medications he is taking any more, that he will just say "they are all in his body". Janett Billow said he needs to be rescheduled for a visit and they are concerned regarding his Plavix use and that they do not have orders to work on him/have him hold Plavix for cleanings.

## 2013-09-01 ENCOUNTER — Ambulatory Visit
Admission: RE | Admit: 2013-09-01 | Discharge: 2013-09-01 | Disposition: A | Discharge status: Home / Self Care | Payer: Medicare Other | Source: Ambulatory Visit | Attending: Radiation Oncology | Admitting: Radiation Oncology

## 2013-09-01 ENCOUNTER — Encounter: Payer: Self-pay | Admitting: Radiation Oncology

## 2013-09-01 VITALS — BP 133/54 | HR 63 | Temp 97.7°F | Ht 66.0 in | Wt 170.0 lb

## 2013-09-01 DIAGNOSIS — C61 Malignant neoplasm of prostate: Secondary | ICD-10-CM

## 2013-09-01 DIAGNOSIS — Z79899 Other long term (current) drug therapy: Secondary | ICD-10-CM | POA: Insufficient documentation

## 2013-09-01 DIAGNOSIS — R011 Cardiac murmur, unspecified: Secondary | ICD-10-CM | POA: Insufficient documentation

## 2013-09-01 DIAGNOSIS — Z7982 Long term (current) use of aspirin: Secondary | ICD-10-CM | POA: Insufficient documentation

## 2013-09-01 DIAGNOSIS — R059 Cough, unspecified: Secondary | ICD-10-CM | POA: Insufficient documentation

## 2013-09-01 DIAGNOSIS — R05 Cough: Secondary | ICD-10-CM | POA: Insufficient documentation

## 2013-09-01 LAB — PSA: PSA: 11.85 ng/mL — ABNORMAL HIGH (ref <=4.00)

## 2013-09-01 NOTE — Progress Notes (Signed)
Radiation Oncology         [phone] ________________________________  Name: Logan Mclean MRN: [id number]  Date: 09/01/2013  DOB: 21-Jun-1940  Follow-Up Visit Note  CC: Sherrie Mustache, MD  [name], MD  Diagnosis:   Stage TIC Gleason's 7 adenocarcinoma the prostate.  Interval Since Last Radiation:  13  months  Narrative:  The patient returns today for routine follow-up.  The patient returns today for repeat PSA. His last PSA showed a significant elevation 12.87 up from 5.37 approximately 10 months ago.  The patient denies any new symptoms.  he is having some urinary intermittency of his stream.  He has good flow after taking his Lasix. He denies any hematuria chills or fever. He denies any bowel complaints. He denies any new bony pain.                              ALLERGIES:  is allergic to compazine.  Meds: Current Outpatient Prescriptions  Medication Sig Dispense Refill  . aspirin 81 MG tablet Take 81 mg by mouth daily.      Marland Kitchen atorvastatin (LIPITOR) 40 MG tablet Take 1 tablet (40 mg total) by mouth daily at 6 PM.  30 tablet  6  . clopidogrel (PLAVIX) 75 MG tablet Take 1 tablet (75 mg total) by mouth daily.  90 tablet  3  . fish oil-omega-3 fatty acids 1000 MG capsule Take 1 g by mouth daily.      . furosemide (LASIX) 40 MG tablet Take 20 mg by mouth daily. Will take 1 extra 20mg  dose if short of breath      . lisinopril (PRINIVIL,ZESTRIL) 2.5 MG tablet Take 1 tablet (2.5 mg total) by mouth daily.  30 tablet  11  . metoprolol tartrate (LOPRESSOR) 25 MG tablet Take 1 tablet (25 mg total) by mouth daily.  30 tablet  10  . potassium chloride SA (K-DUR,KLOR-CON) 20 MEQ tablet Take 20 mEq by mouth every other day.      . Tamsulosin HCl (FLOMAX) 0.4 MG CAPS Take 0.4 mg by mouth daily.      . verapamil (VERELAN PM) 120 MG 24 hr capsule Take 120 mg by mouth at bedtime.      . metoprolol succinate (TOPROL-XL) 25 MG 24 hr tablet Take 1 tablet (25 mg total) by mouth daily.   30 tablet  6   No current facility-administered medications for this encounter.    Physical Findings: The patient is in no acute distress. Patient is alert and oriented.  height is 5\' 6"  (1.676 m) and weight is 170 lb (77.111 kg). His temperature is 97.7 F (36.5 C). His blood pressure is 133/54 and his pulse is 63. .  The lungs are clear. The heart has a regular rhythm and rate with a harsh murmur   Lab Findings: Lab Results  Component Value Date   WBC 6.8 11/03/2012   HGB 12.4* 11/03/2012   HCT 35.7* 11/03/2012   MCV 90.8 11/03/2012   PLT 263 11/03/2012        Ref Range 19mo ago  92mo ago    PSA <=4.00 ng/mL 12.87 (H)  5.37 (H) CM  Comments: Test Methodology: ECLIA PSA (Electrochemiluminescence Immunoassay) For PSA values from 2.5-4.0, particularly in younger men <75 yearsold, the AUA and NCCN suggest testing for % Free PSA (3515) andevaluation of the rate of increase in PSA (PSA velocity).  Resulting Agency  [facility]  Result Narrative    Performed at: [facility], [address] [place], [place] [address]      Specimen Collected: 07/28/13 10:31 AM Last Resulted: 07/28/13 7:17 PM           CM=Additional comments      Radiographic Findings: No results found.  Impression:  Gleason's 7 adenocarcinoma the prostate status post IMRT 13 months ago.  Plan:  The patient will present to the lab for a repeat PSA. If This continues to show elevation he will be referred to urology.  He would prefer to followup with urology here in [place] and I will refer him to Dr. Irine Seal if PSA continues to be an issue. Routine followup in radiation oncology in 3 months.  ____________________________________ Blair Promise, MD

## 2013-09-01 NOTE — Progress Notes (Signed)
Logan Mclean here for follow up after treatment to his prostate.  He denies pain.  He reports a frequent cough.  He reports getting up 3-4 times per night to urinate.  He reports not emptying his bladder completely.  He denies hematuria, dysuria and nausea. He reports occasional diarrhea.  He reports fatigue with activity.

## 2013-09-06 ENCOUNTER — Telehealth: Payer: Self-pay | Admitting: Oncology

## 2013-09-06 NOTE — Telephone Encounter (Signed)
Lina Sayre Maneri's wife called and said Tyr was wondering about his PSA results from last week.  Called her back and let her know the results per Dr. Sondra Come.  Also let her know that Dr. Sondra Come would like to see Aubrey in three months.  Transferred her to Suanne Marker to make the appointment.

## 2013-09-12 ENCOUNTER — Telehealth: Payer: Self-pay | Admitting: Internal Medicine

## 2013-09-12 NOTE — Telephone Encounter (Signed)
Please call,his feet and ankles are swelling.

## 2013-09-12 NOTE — Telephone Encounter (Signed)
Returned call.  Wife stated pt stepped out and asked if RN would call his cell.  Call to cell and verified x 2.  Pt stated he has been fighting pneumonia and is taking Abx and Mucinex and will see PCP tomorrow.  Pt c/o swelling in feet and ankles x ~ 5 days.  Stated the swelling is better now than when he woke up this morning.  May have compression hose at home.  Will check.  Breathing better w/ pneumonia.  Thinks he can go back to taking Lasix once daily.  Had been taking twice daily and swelling better.  Will have PCP evaluate tomorrow and if he thinks he should call cardiology, pt will call back tomorrow.  Pt w/o any audible difficulty breathing.  Pt did have prn Lasix 20 mg for SOB, but has taken it for swelling.  Will await call back after PCP visit if needed.  Pt advised to elevate LEs when sitting and lying down and decrease sodium intake.  Also advised to be sure to stay hydrated while taking increased Lasix dose.  Pt verbalized understanding and agreed w/ plan.   Message forwarded to Dr. Debara Pickett [facility]).

## 2013-11-01 ENCOUNTER — Other Ambulatory Visit: Payer: Self-pay | Admitting: *Deleted

## 2013-11-01 MED ORDER — POTASSIUM CHLORIDE CRYS ER 20 MEQ PO TBCR: 20.0000 meq | EXTENDED_RELEASE_TABLET | ORAL | Status: DC

## 2013-12-05 ENCOUNTER — Ambulatory Visit
Admission: RE | Admit: 2013-12-05 | Discharge: 2013-12-05 | Disposition: A | Discharge status: Home / Self Care | Payer: Medicare Other | Source: Ambulatory Visit | Attending: Radiation Oncology | Admitting: Radiation Oncology

## 2013-12-05 ENCOUNTER — Encounter: Payer: Self-pay | Admitting: Radiation Oncology

## 2013-12-05 VITALS — BP 148/64 | HR 70 | Temp 98.0°F | Ht 66.0 in | Wt 166.0 lb

## 2013-12-05 DIAGNOSIS — C61 Malignant neoplasm of prostate: Secondary | ICD-10-CM

## 2013-12-05 DIAGNOSIS — Z79899 Other long term (current) drug therapy: Secondary | ICD-10-CM | POA: Insufficient documentation

## 2013-12-05 DIAGNOSIS — Z7982 Long term (current) use of aspirin: Secondary | ICD-10-CM | POA: Insufficient documentation

## 2013-12-05 DIAGNOSIS — Z923 Personal history of irradiation: Secondary | ICD-10-CM | POA: Insufficient documentation

## 2013-12-05 MED ORDER — TAMSULOSIN HCL 0.4 MG PO CAPS: 0.4000 mg | ORAL_CAPSULE | Freq: Every day | ORAL | Status: DC

## 2013-12-05 NOTE — Progress Notes (Signed)
Radiation Oncology         [phone] ________________________________  Name: Logan Mclean MRN: [id number]  Date: 12/05/2013  DOB: 05/24/1941  Follow-Up Visit Note  CC: Sherrie Mustache, MD  [name], MD  Diagnosis:  Stage TIC Gleason's 7 adenocarcinoma the prostate   Interval Since Last Radiation:  One year and 5 months   Narrative:  The patient returns today for close followup. On the patient's last evaluation the patient's PSA had increased to 12.87 up from 5.37 approximately 10 months prior. Patient had additional reading which apparently was in the low 20s but the patient did have a an infection at that time was on antibiotics. The patient may have had possibly prostatitis along with his pneumonia. Patient has recently run out of his Flomax and has noticed more a change in his stream and is requesting a refill this medication. He tolerates this medication well without any dizziness or other issues. This denies any hematuria or rectal bleeding. He has nocturia x4 which is his baseline prior to radiation treatment.                            ALLERGIES:  is allergic to compazine.  Meds: Current Outpatient Prescriptions  Medication Sig Dispense Refill  . aspirin 81 MG tablet Take 81 mg by mouth daily.      Marland Kitchen atorvastatin (LIPITOR) 40 MG tablet Take 1 tablet (40 mg total) by mouth daily at 6 PM.  30 tablet  6  . clopidogrel (PLAVIX) 75 MG tablet Take 1 tablet (75 mg total) by mouth daily.  90 tablet  3  . fish oil-omega-3 fatty acids 1000 MG capsule Take 1 g by mouth daily.      . furosemide (LASIX) 40 MG tablet Take 20 mg by mouth daily. Will take 1 extra 20mg  dose if short of breath      . lisinopril (PRINIVIL,ZESTRIL) 2.5 MG tablet Take 1 tablet (2.5 mg total) by mouth daily.  30 tablet  11  . metoprolol succinate (TOPROL-XL) 25 MG 24 hr tablet Take 1 tablet (25 mg total) by mouth daily.  30 tablet  6  . metoprolol tartrate (LOPRESSOR) 25 MG tablet Take 1 tablet (25  mg total) by mouth daily.  30 tablet  10  . potassium chloride SA (K-DUR,KLOR-CON) 20 MEQ tablet Take 1 tablet (20 mEq total) by mouth every other day.  30 tablet  9  . tamsulosin (FLOMAX) 0.4 MG CAPS capsule Take 1 capsule (0.4 mg total) by mouth daily.  30 capsule  5  . verapamil (VERELAN PM) 120 MG 24 hr capsule Take 120 mg by mouth at bedtime.       No current facility-administered medications for this encounter.    Physical Findings: The patient is in no acute distress. Patient is alert and oriented.  height is 5\' 6"  (1.676 m) and weight is 166 lb (75.297 kg). His oral temperature is 98 F (36.7 C). His blood pressure is 148/64 and his pulse is 70. Marland Kitchen  No palpable supraclavicular or axillary adenopathy. The lungs are clear to auscultation. The heart has regular rhythm with a significant  Murmur. Lab Findings: Lab Results  Component Value Date   WBC 6.8 11/03/2012   HGB 12.4* 11/03/2012   HCT 35.7* 11/03/2012   MCV 90.8 11/03/2012   PLT 263 11/03/2012    Radiographic Findings: No results found.  Impression:  Stage TI C. Gleason's 7 adenocarcinoma prostate  status post I MRT  Plan:  PSA blood test today. If the patient's PSA continues to be elevated he will be referred to [name] for evaluation.  He is no longer following with Dr. Michela Pitcher.  ____________________________________ Blair Promise, MD

## 2013-12-05 NOTE — Progress Notes (Signed)
Logan Mclean here for follow up after treatment to his prostate.  He denies pain.  He reports having urinary frequency.  He also has run out of Flomax and has noticed a change in the strength of his stream.  He denies dysuria and hematuria.  He reports getting up 4 times per night to urinate.  He is concerned about his last PSA level and is wondering if he will have one drawn today.  He reports fatigue with activity.

## 2013-12-06 ENCOUNTER — Telehealth: Payer: Self-pay | Admitting: Oncology

## 2013-12-06 LAB — PSA: PSA: 12.99 ng/mL — AB (ref <=4.00)

## 2013-12-06 NOTE — Telephone Encounter (Signed)
Logan Mclean called asking for his PSA results.  Let him know that it was 12.99 per Dr. Sondra Come.  Asked him if he would like to see Dr. Roni Bread with urology or follow up with Dr. Sondra Come in 3 months.  He would like to see Dr. Roni Bread.

## 2013-12-08 ENCOUNTER — Other Ambulatory Visit: Payer: Self-pay | Admitting: Radiation Oncology

## 2013-12-08 DIAGNOSIS — C61 Malignant neoplasm of prostate: Secondary | ICD-10-CM

## 2013-12-12 ENCOUNTER — Telehealth: Payer: Self-pay | Admitting: *Deleted

## 2013-12-12 NOTE — Telephone Encounter (Signed)
Called patient to inform of appt. With Dr. Irine Seal on 01-16-14- arrival time - 1:15 pm, no answer will call later.

## 2013-12-13 ENCOUNTER — Telehealth: Payer: Self-pay | Admitting: *Deleted

## 2013-12-13 NOTE — Telephone Encounter (Signed)
Called patient to inform of appt. With [name] on 01-16-14- arrival time - 1:15 pm, spoke with patient's wife and she is aware of this appt.

## 2013-12-21 ENCOUNTER — Ambulatory Visit (INDEPENDENT_AMBULATORY_CARE_PROVIDER_SITE_OTHER): Payer: Medicare Other | Admitting: Internal Medicine

## 2013-12-21 ENCOUNTER — Encounter: Payer: Self-pay | Admitting: Internal Medicine

## 2013-12-21 VITALS — BP 100/52 | HR 48 | Ht 66.0 in | Wt 165.5 lb

## 2013-12-21 DIAGNOSIS — I2510 Atherosclerotic heart disease of native coronary artery without angina pectoris: Secondary | ICD-10-CM

## 2013-12-21 DIAGNOSIS — I421 Obstructive hypertrophic cardiomyopathy: Secondary | ICD-10-CM

## 2013-12-21 DIAGNOSIS — I498 Other specified cardiac arrhythmias: Secondary | ICD-10-CM

## 2013-12-21 DIAGNOSIS — R001 Bradycardia, unspecified: Secondary | ICD-10-CM

## 2013-12-21 DIAGNOSIS — E785 Hyperlipidemia, unspecified: Secondary | ICD-10-CM

## 2013-12-21 DIAGNOSIS — I2584 Coronary atherosclerosis due to calcified coronary lesion: Secondary | ICD-10-CM

## 2013-12-21 DIAGNOSIS — Z923 Personal history of irradiation: Secondary | ICD-10-CM

## 2013-12-21 DIAGNOSIS — I5032 Chronic diastolic (congestive) heart failure: Secondary | ICD-10-CM

## 2013-12-21 DIAGNOSIS — C61 Malignant neoplasm of prostate: Secondary | ICD-10-CM

## 2013-12-21 LAB — LIPID PANEL
CHOLESTEROL: 113 mg/dL (ref 0–200)
HDL: 43 mg/dL (ref >39)
LDL Cholesterol: 61 mg/dL (ref 0–99)
Total CHOL/HDL Ratio: 2.6 Ratio
Triglycerides: 45 mg/dL (ref <150)
VLDL: 9 mg/dL (ref 0–40)

## 2013-12-21 NOTE — Progress Notes (Signed)
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. He has a history of hypertrophic obstructive cardiomyopathy, dyslipidemia and a history of prostate cancer in the past. He has a history of complex coronary artery disease. In 2005 he underwent cardiac catheterization and under ultimately had drug-eluting stenting x3 of the LAD and was noted to have a dominant right coronary moderate size circumflex artery. His last catheterization was in 2006 and since then has had lower stress test. He also has significant diastolic heart failure.  He was seen by myself in the emergency department last year for acute on chronic diastolic heart failure.  He is stage II diastolic dysfunction on his echo and responded to diuresis. Echo does have suggested that the areas CM and at least a 41 mm outflow tract gradient with valsalva.  This appears to be fairly stable. His weight on discharge from the hospital last year was 168 pounds and is stable today.  He denies any worsening shortness of breath and only reports a small amount of lower extremity swelling.  Logan Mclean returns today for followup. He reports some fatigue and lack of energy. He is noted to be bradycardic today with a long first degree AV block in the 40s. Blood pressure was 100/52. He is occasionally missed days of his medications and says he feels much better the next day, therefore I suspect that he is medications are contributing to his symptoms. He does have hypertrophic cardiomyopathy with a thick septum which may be contributing to conduction system disease.  PMHx:  Past Medical History  Diagnosis Date  . Coronary artery disease     Stents  . Hemorrhoids   . IHSS (idiopathic hypertrophic subaortic stenosis) 2006  . Prostate cancer 02/19/12   Adenocarcinoa,3+4=7,(PSA=8.0 on 11/11/11  . BPH (benign prostatic hyperplasia)   . Obstructive uropathy   . Cardiomyopathy   . PSA elevation 2011    5.4   . H/O cardiac catheterization     several with stents  . Heart murmur   . Acute CHF 10/29/2012  . Family history of anesthesia complication     mother had problems"   . Shortness of breath   . Hx of radiation therapy 04/29/12- 06/28/12    prostate 78 gray in 40 tx  . Hyperlipidemia     Past Surgical History  Procedure Laterality Date  . Hernia repair Bilateral     inguinal  . Colonoscopy    . Prostate biopsy  02/19/2012    Procedure: PROSTATE BIOPSY;  Surgeon: [name], MD;  Location: [facility];  Service: Urology;  Laterality: N/A;  . Band hemorrhoidectomy    . Transthoracic echocardiogram  08/26/2012    EF 55-65%, mod conc LVH, grade 1 diastolic dysfunction; ventricular septum mod increased; mild AV regurg; mod MR; tricuspid valve prolpase  . Nm myocar perf wall motion  07/03/2011    lexiscan myoview; perfusion defect in inferior myocardium (diaphragmatic attenuation), remaining myocardium with normal perfusion; post-stress EF 56%  . Cardiac catheterization  05/10/2004    CAD with crescendo angina, high-grade prox mid and mid-distal LAD with DES 3.5x69mm Cordis Cypher DES(Dr. [name])   . Cardiac catheterization  03/25/2005    DES X3 (3x5x36mm, 3x44mm, 2.5x72mm) of LAD, single-vessel diease with dominant right and moderate sized Cfx (Dr. [name])     FAMHx:  Family History  Problem Relation Age of Onset  . Colon cancer Mother 61  . Colon cancer Sister   . Brain cancer Daughter 58    deceased, diabetes  . Heart attack Father   . Stroke Maternal Grandmother   . Heart attack Brother 74  . Heart attack Brother     1st MI at 82    SOCHx:   reports that he quit smoking about 30 years ago. He quit smokeless tobacco use about 28 years ago. His smokeless tobacco use included Chew. He reports that he drinks alcohol.  He reports that he does not use illicit drugs.  ALLERGIES:  Allergies  Allergen Reactions  . Compazine [Prochlorperazine] Hives    ROS: A comprehensive review of systems was negative except for: Respiratory: positive for dyspnea on exertion Cardiovascular: positive for lower extremity edema  HOME MEDS: Current Outpatient Prescriptions  Medication Sig Dispense Refill  . aspirin 81 MG tablet Take 81 mg by mouth daily.      Marland Kitchen atorvastatin (LIPITOR) 40 MG tablet Take 1 tablet (40 mg total) by mouth daily at 6 PM.  30 tablet  6  . clopidogrel (PLAVIX) 75 MG tablet Take 1 tablet (75 mg total) by mouth daily.  90 tablet  3  . fish oil-omega-3 fatty acids 1000 MG capsule Take 1 g by mouth daily.      . furosemide (LASIX) 40 MG tablet Take 20 mg by mouth daily. Will take 1 extra 20mg  dose if short of breath      . lisinopril (PRINIVIL,ZESTRIL) 2.5 MG tablet Take 1 tablet (2.5 mg total) by mouth daily.  30 tablet  11  . potassium chloride SA (K-DUR,KLOR-CON) 20 MEQ tablet Take 1 tablet (20 mEq total) by mouth every other day.  30 tablet  9  . tamsulosin (FLOMAX) 0.4 MG CAPS capsule Take 1 capsule (0.4 mg total) by mouth daily.  30 capsule  5  . verapamil (VERELAN PM) 120 MG 24 hr capsule Take 120 mg by mouth at bedtime.       No current facility-administered medications for this visit.    LABS/IMAGING: No results found for this or any previous visit (from the past 48 hour(s)). No results found.  VITALS: BP 100/52  Pulse 48  Ht 5\' 6"  (1.676 m)  Wt 165 lb 8 oz (75.07 kg)  BMI 26.73 kg/m2  EXAM: General appearance: alert and no distress Neck: no carotid bruit and no JVD Lungs: rales bibasilar Heart: regular rate and rhythm, S1, S2 normal and systolic murmur: late systolic 3/6, decrescendo at apex Abdomen: soft, non-tender; bowel sounds normal; no masses,  no organomegaly Extremities: edema trace bilateral Pulses: 2+ and symmetric Skin: Skin color, texture, turgor normal. No rashes  or lesions Neurologic: Grossly normal Psych: Pleasant, normal  EKG: Sinus rhythm with first degree AV block, incomplete left bundle branch block, LVH with repolarization abnormality at 48  ASSESSMENT: 1. Hypertrophic structure cardiomyopathy-41 mmHg gradient with Valsalva 2. Coronary artery disease with DES x3 the LAD 3. Hypertension 4. Dyslipidemia 5. Chronic diastolic heart failure 6. History of prostate cancer 7. Symptomatic bradycardia  PLAN: 1.   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 He does have faint bibasilar crackles, but has stable shortness of breath. His weight is actually less than it has been recently. Unfortunately he tells me that his PSA has gone up and they're concerned about recurrent prostate cancer. His blood pressure and heart rate are low today I suspect he  is developing worsening conduction system disease. I would recommend discontinuing his Toprol and will plan to see him back in one month to review his symptoms. Will also send him for screening cholesterol profile today as well.  Pixie Casino, MD, [facility] Attending Cardiologist [facility]  HILTY,Kenneth C 12/21/2013, 10:50 AM

## 2013-12-21 NOTE — Patient Instructions (Signed)
Your physician recommends that you return for lab work in: [place].  Your physician has recommended you make the following change in your medication: STOP toprol.   Your physician recommends that you schedule a follow-up appointment in: 1 month.

## 2013-12-22 ENCOUNTER — Encounter: Payer: Self-pay | Admitting: *Deleted

## 2013-12-23 ENCOUNTER — Encounter: Payer: Self-pay | Admitting: Internal Medicine

## 2013-12-23 NOTE — Telephone Encounter (Signed)
This encounter was created in error - please disregard.

## 2013-12-23 NOTE — Telephone Encounter (Signed)
Pt would like his lab results from 12-19-13 please.

## 2014-01-17 ENCOUNTER — Other Ambulatory Visit: Payer: Self-pay | Admitting: Urology

## 2014-01-17 DIAGNOSIS — C61 Malignant neoplasm of prostate: Secondary | ICD-10-CM

## 2014-01-24 ENCOUNTER — Telehealth: Payer: Self-pay | Admitting: Internal Medicine

## 2014-01-24 NOTE — Telephone Encounter (Signed)
Pt has an appt on Friday,wants to know if he needs to come in fastening?

## 2014-01-24 NOTE — Telephone Encounter (Signed)
Returned a call to patient informing him that unless he hasn't had any recent bloodwork then he should not have to fast for a routine appointment. Patient states that his cholesterol was checked a couple of months ago.

## 2014-01-26 ENCOUNTER — Ambulatory Visit: Payer: Medicare Other | Admitting: Radiation Oncology

## 2014-01-27 ENCOUNTER — Encounter: Payer: Self-pay | Admitting: Internal Medicine

## 2014-01-27 ENCOUNTER — Ambulatory Visit (INDEPENDENT_AMBULATORY_CARE_PROVIDER_SITE_OTHER): Payer: Medicare Other | Admitting: Internal Medicine

## 2014-01-27 VITALS — BP 156/75 | HR 60 | Ht 66.0 in | Wt 166.0 lb

## 2014-01-27 DIAGNOSIS — I421 Obstructive hypertrophic cardiomyopathy: Secondary | ICD-10-CM

## 2014-01-27 DIAGNOSIS — C61 Malignant neoplasm of prostate: Secondary | ICD-10-CM

## 2014-01-27 DIAGNOSIS — I5032 Chronic diastolic (congestive) heart failure: Secondary | ICD-10-CM

## 2014-01-27 DIAGNOSIS — I2510 Atherosclerotic heart disease of native coronary artery without angina pectoris: Secondary | ICD-10-CM

## 2014-01-27 NOTE — Patient Instructions (Signed)
Follow up in 6 months 

## 2014-01-27 NOTE — Progress Notes (Signed)
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. He has a history of hypertrophic obstructive cardiomyopathy, dyslipidemia and a history of prostate cancer in the past. He has a history of complex coronary artery disease. In 2005 he underwent cardiac catheterization and under ultimately had drug-eluting stenting x3 of the LAD and was noted to have a dominant right coronary moderate size circumflex artery. His last catheterization was in 2006 and since then has had lower stress test. He also has significant diastolic heart failure.  He was seen by myself in the emergency department last year for acute on chronic diastolic heart failure.  He is stage II diastolic dysfunction on his echo and responded to diuresis. Echo does have suggested that the areas CM and at least a 41 mm outflow tract gradient with valsalva.  This appears to be fairly stable. His weight on discharge from the hospital last year was 168 pounds and is stable today.  He denies any worsening shortness of breath and only reports a small amount of lower extremity swelling.  Logan Mclean was seen recently in follow-up. He reported some fatigue and lack of energy. He was noted to be bradycardic today with a long first degree AV block in the 40s. Blood pressure was 100/52. I recommended discontinuing his beta blocker. Since doing that he's had a marked improvement in his energy level. His blood pressure today was 120/82 when I rechecked it. He had not taken his lisinopril this morning however it is low dose at 2.5 mg. Heart rate is now in the 60s   PMHx:  Past Medical History  Diagnosis Date  . Coronary artery disease     Stents  . Hemorrhoids   . IHSS (idiopathic hypertrophic subaortic stenosis) 2006  . Prostate cancer 02/19/12    Adenocarcinoa,3+4=7,(PSA=8.0  on 11/11/11  . BPH (benign prostatic hyperplasia)   . Obstructive uropathy   . Cardiomyopathy   . PSA elevation 2011    5.4   . H/O cardiac catheterization     several with stents  . Heart murmur   . Acute CHF 10/29/2012  . Family history of anesthesia complication     mother had problems"   . Shortness of breath   . Hx of radiation therapy 04/29/12- 06/28/12    prostate 78 gray in 40 tx  . Hyperlipidemia     Past Surgical History  Procedure Laterality Date  . Hernia repair Bilateral     inguinal  . Colonoscopy    . Prostate biopsy  02/19/2012    Procedure: PROSTATE BIOPSY;  Surgeon: [name], MD;  Location: [facility];  Service: Urology;  Laterality: N/A;  . Band hemorrhoidectomy    . Transthoracic echocardiogram  08/26/2012    EF 55-65%, mod conc LVH, grade 1 diastolic dysfunction; ventricular septum mod increased; mild AV regurg; mod MR; tricuspid valve prolpase  . Nm myocar perf wall motion  07/03/2011    lexiscan myoview; perfusion defect in inferior myocardium (diaphragmatic attenuation), remaining myocardium with normal perfusion; post-stress EF 56%  . Cardiac catheterization  05/10/2004    CAD with crescendo angina, high-grade prox mid and mid-distal LAD with DES 3.5x25mm Cordis Cypher DES(Dr. [name])   . Cardiac catheterization  03/25/2005    DES X3 (3x5x23mm, 3x59mm, 2.5x77mm) of LAD, single-vessel diease with dominant right and moderate sized  Cfx (Dr. [name])     FAMHx:  Family History  Problem Relation Age of Onset  . Colon cancer Mother 55  . Colon cancer Sister   . Brain cancer Daughter 56    deceased, diabetes  . Heart attack Father   . Stroke Maternal Grandmother   . Heart attack Brother 62  . Heart attack Brother     1st MI at 33    SOCHx:   reports that he quit smoking about 30 years ago. He quit smokeless tobacco use about 28 years ago. His smokeless tobacco use included Chew. He reports that he drinks alcohol. He reports that he does not  use illicit drugs.  ALLERGIES:  Allergies  Allergen Reactions  . Compazine [Prochlorperazine] Hives    ROS: A comprehensive review of systems was negative except for: Respiratory: positive for dyspnea on exertion Cardiovascular: positive for lower extremity edema  HOME MEDS: Current Outpatient Prescriptions  Medication Sig Dispense Refill  . aspirin 81 MG tablet Take 81 mg by mouth daily.      Marland Kitchen atorvastatin (LIPITOR) 40 MG tablet Take 1 tablet (40 mg total) by mouth daily at 6 PM.  30 tablet  6  . clopidogrel (PLAVIX) 75 MG tablet Take 1 tablet (75 mg total) by mouth daily.  90 tablet  3  . fish oil-omega-3 fatty acids 1000 MG capsule Take 1 g by mouth daily.      . furosemide (LASIX) 40 MG tablet Take 20 mg by mouth daily as needed.       Marland Kitchen lisinopril (PRINIVIL,ZESTRIL) 2.5 MG tablet Take 1 tablet (2.5 mg total) by mouth daily.  30 tablet  11  . potassium chloride SA (K-DUR,KLOR-CON) 20 MEQ tablet Take 20 mEq by mouth daily as needed (take with lasix).      . tamsulosin (FLOMAX) 0.4 MG CAPS capsule Take 1 capsule (0.4 mg total) by mouth daily.  30 capsule  5  . verapamil (VERELAN PM) 120 MG 24 hr capsule Take 120 mg by mouth at bedtime.       No current facility-administered medications for this visit.    LABS/IMAGING: No results found for this or any previous visit (from the past 48 hour(s)). No results found.  VITALS: BP 156/75  Pulse 60  Ht 5\' 6"  (1.676 m)  Wt 166 lb (75.297 kg)  BMI 26.81 kg/m2  EXAM: deferred  EKG: deferred  ASSESSMENT: 1. Hypertrophic structure cardiomyopathy-41 mmHg gradient with Valsalva 2. Coronary artery disease with DES x3 the LAD 3. Hypertension 4. Dyslipidemia 5. Chronic diastolic heart failure 6. History of prostate cancer 7. Symptomatic bradycardia - improved  PLAN: 1.   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 His heart rate is now improved with improvement in his blood pressure off of carvedilol.  Recheck blood pressure was 120/82. He is encouraged to continue to take his low-dose lisinopril. For now he will have to stay off of beta blocker due to heart block which may be related to his hypertrophic obstructive cardiomyopathy. He reports that his PSA has recently been rising which is concerning and is due to go through some more therapy for prostate cancer. He may be at intermediate to high risk for surgery however it likely could be performed without significant cardiac consequences.  Pixie Casino, MD, [facility] Attending Cardiologist [facility]  HILTY,Kenneth C 01/27/2014, 8:16 AM

## 2014-02-01 ENCOUNTER — Encounter (HOSPITAL_COMMUNITY)
Admission: RE | Admit: 2014-02-01 | Discharge: 2014-02-01 | Disposition: A | Discharge status: Home / Self Care | Payer: Medicare Other | Source: Ambulatory Visit | Attending: Urology | Admitting: Urology

## 2014-02-01 DIAGNOSIS — C61 Malignant neoplasm of prostate: Secondary | ICD-10-CM

## 2014-02-01 MED ORDER — TECHNETIUM TC 99M MEDRONATE IV KIT
25.3000 | PACK | Freq: Once | INTRAVENOUS | Status: AC | PRN
  Administered 2014-02-01: 25.3 via INTRAVENOUS

## 2014-03-16 ENCOUNTER — Ambulatory Visit
Admission: RE | Admit: 2014-03-16 | Discharge: 2014-03-16 | Disposition: A | Discharge status: Home / Self Care | Payer: Medicare Other | Source: Ambulatory Visit | Attending: Radiation Oncology | Admitting: Radiation Oncology

## 2014-03-16 ENCOUNTER — Encounter: Payer: Self-pay | Admitting: Radiation Oncology

## 2014-03-16 VITALS — BP 129/65 | HR 61 | Temp 98.0°F | Resp 16 | Ht 66.0 in | Wt 165.6 lb

## 2014-03-16 DIAGNOSIS — C61 Malignant neoplasm of prostate: Secondary | ICD-10-CM

## 2014-03-16 NOTE — Progress Notes (Signed)
Radiation Oncology         [phone]) [phone] ________________________________  Name: Logan Mclean MRN: [id number]  Date: 03/16/2014  DOB: 05/28/1941  Follow-Up Visit Note  CC: Logan Mustache, MD  Logan Housekeeper, MD  Diagnosis: Stage TIC Gleason's 7 adenocarcinoma the prostate     Interval Since Last Radiation:  One year and 9 months   Narrative:  The patient returns today for routine follow-up.  Since my last followup patient was seen by urology. A repeat PSA showed further elevation to 17. Patient did undergo a bone scan which showed no osseous metastasis but a CT scan of the pelvis area showed 3 perirectal lymph nodes suspicious for metastasis. Clinically the patient is been doing well. He denies any new bony pain. He continues to have frequent nocturia is his baseline. He denies any hematuria dysuria or bowel problems. He denies any rectal bleeding. His fatigue has improved since stopping Lopressor.                             ALLERGIES:  is allergic to compazine.  Meds: Current Outpatient Prescriptions  Medication Sig Dispense Refill  . aspirin 81 MG tablet Take 81 mg by mouth daily.      Marland Kitchen atorvastatin (LIPITOR) 40 MG tablet Take 1 tablet (40 mg total) by mouth daily at 6 PM.  30 tablet  6  . clopidogrel (PLAVIX) 75 MG tablet Take 1 tablet (75 mg total) by mouth daily.  90 tablet  3  . fish oil-omega-3 fatty acids 1000 MG capsule Take 1 g by mouth daily.      Marland Kitchen lisinopril (PRINIVIL,ZESTRIL) 2.5 MG tablet Take 1 tablet (2.5 mg total) by mouth daily.  30 tablet  11  . tamsulosin (FLOMAX) 0.4 MG CAPS capsule Take 1 capsule (0.4 mg total) by mouth daily.  30 capsule  5  . verapamil (VERELAN PM) 120 MG 24 hr capsule Take 120 mg by mouth at bedtime.      . furosemide (LASIX) 40 MG tablet Take 20 mg by mouth daily as needed.       . metoprolol succinate (TOPROL-XL) 25 MG 24 hr tablet Take 25 mg by mouth daily.      . potassium chloride SA (K-DUR,KLOR-CON) 20 MEQ tablet Take 20 mEq  by mouth daily as needed (take with lasix).       No current facility-administered medications for this encounter.    Physical Findings: The patient is in no acute distress. Patient is alert and oriented.  height is 5\' 6"  (1.676 m) and weight is 165 lb 9.6 oz (75.116 kg). His oral temperature is 98 F (36.7 C). His blood pressure is 129/65 and his pulse is 61. His respiration is 16. .  No significant changes.  Lab Findings: Lab Results  Component Value Date   WBC 6.8 11/03/2012   HGB 12.4* 11/03/2012   HCT 35.7* 11/03/2012   MCV 90.8 11/03/2012   PLT 263 11/03/2012      Radiographic Findings: No results found.  Impression:  Stage TIC Gleason's 7 adenocarcinoma the prostate. Status post definitive treatment with IM RT.  PSA and CT scan are suspicious for recurrence   Plan:  The patient will continue close followup with urology and is scheduled for another PSA this fall. The patient may undergo a biopsy depending on Dr. Ralene Muskrat further evaluation. In light of patient's close followup with urology I have not scheduled Logan Mclean for formal  followup appointment but would be glad to see him anytime.   ____________________________________ Blair Promise, MD

## 2014-03-16 NOTE — Progress Notes (Signed)
Logan Mclean here for follow up after treatment for prostate cancer.  He denies pain.  He reports getting up 4 times per night to urinate.  He reports urinating small amounts frequently.  He denies hematuria, dysuria and bowel problems.  He reports his fatigue is improved since he stopped taking lopressor.  He reports seeing Dr. Roni Bread in August and his PSA was 15 and was 17 when retaken.  He had a bone scan which was negative and a CT Scan which showed 3 lymph nodes.  He will see Dr. Roni Bread in November.

## 2014-05-01 ENCOUNTER — Other Ambulatory Visit: Payer: Self-pay

## 2014-05-01 MED ORDER — FUROSEMIDE 40 MG PO TABS: 20.0000 mg | ORAL_TABLET | Freq: Every day | ORAL | Status: AC | PRN

## 2014-05-01 NOTE — Telephone Encounter (Signed)
Rx sent to pharmacy   

## 2014-05-19 ENCOUNTER — Other Ambulatory Visit: Payer: Self-pay | Admitting: Internal Medicine

## 2014-05-19 MED ORDER — VERAPAMIL HCL ER 120 MG PO CP24
120.0000 mg | ORAL_CAPSULE | Freq: Every day | ORAL | Status: DC
Start: 2014-05-19 — End: 2015-04-08

## 2014-05-19 MED ORDER — CLOPIDOGREL BISULFATE 75 MG PO TABS: 75.0000 mg | ORAL_TABLET | Freq: Every day | ORAL | Status: DC

## 2014-05-19 NOTE — Telephone Encounter (Signed)
Rx has been sent to the pharmacy electronically. ° °

## 2014-05-19 NOTE — Telephone Encounter (Signed)
Pt have been waiting for his generic Plavix and Verapamil. Please call this in today,he have not had his medicine for 4 days/Please call to [facility].

## 2014-05-23 ENCOUNTER — Other Ambulatory Visit: Payer: Self-pay | Admitting: Internal Medicine

## 2014-05-25 ENCOUNTER — Telehealth: Payer: Self-pay | Admitting: Internal Medicine

## 2014-05-25 MED ORDER — CLOPIDOGREL BISULFATE 75 MG PO TABS: 75.0000 mg | ORAL_TABLET | Freq: Every day | ORAL | Status: DC

## 2014-05-25 NOTE — Telephone Encounter (Signed)
Pt still have not received his generic Plavix. Please call this to [employer]

## 2014-05-25 NOTE — Telephone Encounter (Signed)
Rx was sent to pharmacy electronically. 

## 2014-07-01 ENCOUNTER — Other Ambulatory Visit: Payer: Self-pay | Admitting: Internal Medicine

## 2014-07-02 NOTE — Telephone Encounter (Signed)
Atorvastatin refilled #30 with 5 05/24/14

## 2014-08-22 ENCOUNTER — Other Ambulatory Visit: Payer: Self-pay | Admitting: Internal Medicine

## 2014-08-22 NOTE — Telephone Encounter (Signed)
Rx(s) sent to pharmacy electronically.  

## 2014-08-23 ENCOUNTER — Telehealth: Payer: Self-pay | Admitting: Oncology

## 2014-08-23 NOTE — Telephone Encounter (Signed)
Called in refill per Dr. Lisbeth Renshaw to [name] for tamsulosin (FLOMAX) 0.4 MG CAPS capsules. Take 1 capsule (0.4 mg total) by mouth daily. Disp. 30 capsules. 0 refills.  Called [place], [name]'s wife, and let her know that a 1 month refill had been called in.  Advised her that Logan Mclean will need to ask [name] at his next appointment in April to continue to refill it because he has been released by Dr. Sondra Come.  Logan Mclean verbalized understanding.

## 2014-09-10 ENCOUNTER — Other Ambulatory Visit: Payer: Self-pay | Admitting: Internal Medicine

## 2014-09-11 NOTE — Telephone Encounter (Signed)
Rx has been sent to the pharmacy electronically. ° °

## 2014-09-13 ENCOUNTER — Telehealth: Payer: Self-pay | Admitting: *Deleted

## 2014-09-13 NOTE — Telephone Encounter (Signed)
Request for surgical clearance  1. What type of surgery is being performed? colonscopy   2. When is this surgery scheduled? 09/28/2014   3. Are there any medications that need to be held prior to surgery and how long? Clopidogrel - # days to be held?   4. Name of physician performing surgery? [facility]   5. What is your office phone and fax number? Phone: [phone] Fax: [phone]

## 2014-09-13 NOTE — Telephone Encounter (Signed)
Hold plavix for 5 days - restart 1-2 days after.   Dr. Lemmie Evens

## 2014-09-13 NOTE — Telephone Encounter (Signed)
Information faxed to [facility]

## 2014-12-27 ENCOUNTER — Other Ambulatory Visit: Payer: Self-pay | Admitting: Internal Medicine

## 2014-12-27 NOTE — Telephone Encounter (Signed)
Rx(s) sent to pharmacy electronically.  

## 2015-02-21 ENCOUNTER — Telehealth: Payer: Self-pay | Admitting: Internal Medicine

## 2015-02-21 ENCOUNTER — Other Ambulatory Visit: Payer: Self-pay | Admitting: Internal Medicine

## 2015-02-21 NOTE — Telephone Encounter (Signed)
Pt called in stating that he was going to the lab to have some blood drawn on 9/29 and he wanted Dr. Debara Pickett to have the orders put in so he can have them done once. Please call back  Thanks

## 2015-02-21 NOTE — Telephone Encounter (Signed)
LVMTCB

## 2015-02-22 ENCOUNTER — Telehealth: Payer: Self-pay | Admitting: Internal Medicine

## 2015-02-22 DIAGNOSIS — I25700 Atherosclerosis of coronary artery bypass graft(s), unspecified, with unstable angina pectoris: Secondary | ICD-10-CM

## 2015-02-22 NOTE — Telephone Encounter (Signed)
Patient wants to know what lab Dr. Debara Pickett would like drawn prior to next OV.  He is having blood drawn from another MD on the 29th and would like to get it all done at the same time  Call patient back at 336- [phone]

## 2015-02-22 NOTE — Telephone Encounter (Signed)
Regular lipid profile and CMET would be fine. Thanks.  Dr. Lemmie Evens

## 2015-02-22 NOTE — Telephone Encounter (Signed)
Logan Mclean is calling because he wanting to know can he have all the labs that Dr.Hilty want done the same time that he has labs on 03/08/15 for Dr. Irine Seal .Marland KitchenPlease call   Thanks

## 2015-02-22 NOTE — Telephone Encounter (Signed)
Close encounter  ° °Did not need to start a note  °

## 2015-02-22 NOTE — Telephone Encounter (Signed)
Patient wants to know what lab Dr. Debara Pickett would like drawn prior to next OV. He is having blood drawn from another MD on the 29th and would like to get it all done at the same time  Call patient back at 336- [phone]

## 2015-02-23 NOTE — Telephone Encounter (Signed)
Addressed in another encounter and labs ordered per MD

## 2015-02-23 NOTE — Telephone Encounter (Signed)
Ordered Lipid profile and CMET.  Called patient .  He would like lab slips sent to his house to take them with him  To the labon 9/29  Lab slips printed and mailed to patients home address

## 2015-03-14 ENCOUNTER — Encounter: Payer: Self-pay | Admitting: Internal Medicine

## 2015-03-14 ENCOUNTER — Ambulatory Visit (INDEPENDENT_AMBULATORY_CARE_PROVIDER_SITE_OTHER): Payer: Medicare Other | Admitting: Internal Medicine

## 2015-03-14 VITALS — BP 148/72 | HR 66 | Ht 64.5 in | Wt 163.4 lb

## 2015-03-14 DIAGNOSIS — I5032 Chronic diastolic (congestive) heart failure: Secondary | ICD-10-CM

## 2015-03-14 DIAGNOSIS — I421 Obstructive hypertrophic cardiomyopathy: Secondary | ICD-10-CM | POA: Diagnosis not present

## 2015-03-14 DIAGNOSIS — I2510 Atherosclerotic heart disease of native coronary artery without angina pectoris: Secondary | ICD-10-CM

## 2015-03-14 DIAGNOSIS — E785 Hyperlipidemia, unspecified: Secondary | ICD-10-CM | POA: Diagnosis not present

## 2015-03-14 DIAGNOSIS — Z79899 Other long term (current) drug therapy: Secondary | ICD-10-CM | POA: Diagnosis not present

## 2015-03-14 DIAGNOSIS — C61 Malignant neoplasm of prostate: Secondary | ICD-10-CM | POA: Diagnosis not present

## 2015-03-14 DIAGNOSIS — R011 Cardiac murmur, unspecified: Secondary | ICD-10-CM

## 2015-03-14 DIAGNOSIS — I2583 Coronary atherosclerosis due to lipid rich plaque: Secondary | ICD-10-CM

## 2015-03-14 LAB — COMPLETE METABOLIC PANEL WITH GFR
ALT: 16 U/L (ref 9–46)
AST: 16 U/L (ref 10–35)
Albumin: 4.4 g/dL (ref 3.6–5.1)
Alkaline Phosphatase: 77 U/L (ref 40–115)
BUN: 15 mg/dL (ref 7–25)
CO2: 26 mmol/L (ref 20–31)
Calcium: 8.9 mg/dL (ref 8.6–10.3)
Chloride: 100 mmol/L (ref 98–110)
Creat: 1.09 mg/dL (ref 0.70–1.18)
GFR, Est African American: 77 mL/min (ref >=60)
GFR, Est Non African American: 67 mL/min (ref >=60)
GLUCOSE: 90 mg/dL (ref 65–99)
Potassium: 4.5 mmol/L (ref 3.5–5.3)
SODIUM: 137 mmol/L (ref 135–146)
TOTAL PROTEIN: 6.8 g/dL (ref 6.1–8.1)
Total Bilirubin: 1.4 mg/dL — ABNORMAL HIGH (ref 0.2–1.2)

## 2015-03-14 LAB — LIPID PANEL
Cholesterol: 115 mg/dL — ABNORMAL LOW (ref 125–200)
HDL: 55 mg/dL (ref >=40)
LDL Cholesterol: 49 mg/dL (ref <130)
Total CHOL/HDL Ratio: 2.1 Ratio (ref <=5.0)
Triglycerides: 53 mg/dL (ref <150)
VLDL: 11 mg/dL (ref <30)

## 2015-03-14 NOTE — Progress Notes (Signed)
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. He has a history of hypertrophic obstructive cardiomyopathy, dyslipidemia and a history of prostate cancer in the past. He has a history of complex coronary artery disease. In 2005 he underwent cardiac catheterization and under ultimately had drug-eluting stenting x3 of the LAD and was noted to have a dominant right coronary moderate size circumflex artery. His last catheterization was in 2006 and since then has had lower stress test. He also has significant diastolic heart failure.  He was seen by myself in the emergency department last year for acute on chronic diastolic heart failure.  He is stage II diastolic dysfunction on his echo and responded to diuresis. Echo does have suggested that the areas CM and at least a 41 mm outflow tract gradient with valsalva.  This appears to be fairly stable. His weight on discharge from the hospital last year was 168 pounds and is stable today.  He denies any worsening shortness of breath and only reports a small amount of lower extremity swelling.  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 He reported some fatigue and lack of energy. He was noted to be bradycardic today with a long first degree AV block in the 40s. Blood pressure was 100/52. I recommended discontinuing his beta blocker. Since doing that he's had a marked improvement in his energy level. His blood pressure today was 120/82 when I rechecked it. He had not taken his lisinopril this morning however it is low dose at 2.5 mg. 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  He is generally without complaints. 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 He denies any chest pain or worsening shortness of breath. He has been battling  prostate cancer and underwent external radiation and still has an elevated PSA. He's currently taking diuretics only as  Needed, especially when he eats salt. He was on both beta blocker and calcium channel blocker however the beta blocker was discontinued by myself due to symptomatic bradycardia. He had marked improvement in his symptoms when his heart rate has come up into the 60s. EKG today shows sinus rhythm with a PVC.  PMHx:  Past Medical History  Diagnosis Date  . Coronary artery disease     Stents  . Hemorrhoids   . IHSS (idiopathic hypertrophic subaortic stenosis) ([place]) 2006  . Prostate cancer ([place]) 02/19/12    Adenocarcinoa,3+4=7,(PSA=8.0 on 11/11/11  . BPH (benign prostatic hyperplasia)   . Obstructive uropathy   . Cardiomyopathy ([place])   . PSA elevation 2011    5.4   . H/O cardiac catheterization     several with stents  . Heart murmur   . Acute CHF ([place]) 10/29/2012  . Family history of anesthesia complication     mother had problems"   . Shortness of breath   . Hx of radiation therapy 04/29/12- 06/28/12    prostate 78 gray in 40 tx  . Hyperlipidemia     Past Surgical History  Procedure Laterality Date  . Hernia repair Bilateral     inguinal  . Colonoscopy    . Prostate biopsy  02/19/2012    Procedure: PROSTATE BIOPSY;  Surgeon: [name], MD;  Location: [facility];  Service: Urology;  Laterality:  N/A;  . Band hemorrhoidectomy    . Transthoracic echocardiogram  08/26/2012    EF 55-65%, mod conc LVH, grade 1 diastolic dysfunction; ventricular septum mod increased; mild AV regurg; mod MR; tricuspid valve prolpase  . Nm myocar perf wall motion  07/03/2011    lexiscan myoview; perfusion defect in inferior myocardium (diaphragmatic attenuation), remaining myocardium with normal perfusion; post-stress EF 56%  . Cardiac catheterization  05/10/2004    CAD with crescendo angina, high-grade prox mid and mid-distal LAD with DES 3.5x46mm Cordis Cypher DES(Dr. [name])   .  Cardiac catheterization  03/25/2005    DES X3 (3x5x47mm, 3x63mm, 2.5x79mm) of LAD, single-vessel diease with dominant right and moderate sized Cfx (Dr. [name])     FAMHx:  Family History  Problem Relation Age of Onset  . Colon cancer Mother 73  . Colon cancer Sister   . Brain cancer Daughter 2    deceased, diabetes  . Heart attack Father   . Stroke Maternal Grandmother   . Heart attack Brother 69  . Heart attack Brother     1st MI at 69    SOCHx:   reports that he quit smoking about 31 years ago. He quit smokeless tobacco use about 29 years ago. His smokeless tobacco use included Chew. He reports that he drinks alcohol. He reports that he does not use illicit drugs.  ALLERGIES:  Allergies  Allergen Reactions  . Compazine [Prochlorperazine] Hives    ROS: A comprehensive review of systems was negative.  HOME MEDS: Current Outpatient Prescriptions  Medication Sig Dispense Refill  . aspirin 81 MG tablet Take 81 mg by mouth daily.    Marland Kitchen atorvastatin (LIPITOR) 40 MG tablet Take 1 tablet (40 mg total) by mouth daily. <PLEASE MAKE APPOINTMENT FOR REFILLS> 30 tablet 1  . clopidogrel (PLAVIX) 75 MG tablet TAKE 1 TABLET (75 MG TOTAL) BY MOUTH DAILY. 90 tablet 0  . fish oil-omega-3 fatty acids 1000 MG capsule Take 1 g by mouth daily.    . furosemide (LASIX) 40 MG tablet Take 0.5 tablets (20 mg total) by mouth daily as needed. 30 tablet 7  . lisinopril (PRINIVIL,ZESTRIL) 2.5 MG tablet TAKE 1 TABLET(S) BY MOUTH DAILY 30 tablet 4  . tamsulosin (FLOMAX) 0.4 MG CAPS capsule Take 1 capsule (0.4 mg total) by mouth daily. 30 capsule 5  . verapamil (VERELAN PM) 120 MG 24 hr capsule Take 1 capsule (120 mg total) by mouth at bedtime. 90 capsule 1   No current facility-administered medications for this visit.    LABS/IMAGING: No results found for this or any previous visit (from the past 48 hour(s)). No results found.  VITALS: BP 148/72 mmHg  Pulse 66  Ht 5' 4.5" (1.638 m)  Wt 163  lb 6.4 oz (74.118 kg)  BMI 27.62 kg/m2  EXAM: General appearance: alert and no distress Neck: no carotid bruit, no JVD, thyroid not enlarged, symmetric, no tenderness/mass/nodules and Bilateral hearing loss Lungs: clear to auscultation bilaterally Heart: regular rate and rhythm, S1, S2 normal and systolic murmur: decreased with valsalva 3/6, crescendo at 2nd right intercostal space Abdomen: soft, non-tender; bowel sounds normal; no masses,  no organomegaly Extremities: extremities normal, atraumatic, no cyanosis or edema Pulses: 2+ and symmetric Skin: Skin color, texture, turgor normal. No rashes or lesions Neurologic: Grossly normal Psych: Pleasant  EKG: Sinus rhythm with 1st degree AVB and PVC, inferolateral ST and T wave changes, stable  ASSESSMENT: 1. Hypertrophic structure cardiomyopathy-41 mmHg gradient with Valsalva 2. Coronary artery disease  with DES x3 the LAD 3. Hypertension 4. Dyslipidemia 5. Chronic diastolic heart failure 6. History of prostate cancer 7. Symptomatic bradycardia on B-blocker - improved  PLAN: 1.   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 He is currently using diuretics as needed. His heart rate is now improved with improvement in his blood pressure off of carvedilol. He is overdue versus benefit hypertrophic cardiomyopathy. His last echo was 2 years ago. There was some change in his gradient today with Valsalva. We'll go ahead and recheck echocardiogram. He is also due for reck lab work including a lipid profile and metabolic profile. I'll contact him with those results and plan to see him back annually or sooner as necessary.  Pixie Casino, MD, [facility] Attending Cardiologist [place] Melvie Paglia 03/14/2015, 8:38 AM

## 2015-03-14 NOTE — Patient Instructions (Signed)
Your physician wants you to follow-up in: 1 Year. You will receive a reminder letter in the mail two months in advance. If you don't receive a letter, please call our office to schedule the follow-up appointment.  Your physician has requested that you have an echocardiogram. Echocardiography is a painless test that uses sound waves to create images of your heart. It provides your doctor with information about the size and shape of your heart and how well your heart's chambers and valves are working. This procedure takes approximately one hour. There are no restrictions for this procedure.  Your physician recommends that you return for lab work in: Today CMP and Fasting lipids

## 2015-03-15 ENCOUNTER — Other Ambulatory Visit: Payer: Self-pay | Admitting: Urology

## 2015-03-15 ENCOUNTER — Encounter: Payer: Self-pay | Admitting: *Deleted

## 2015-03-15 ENCOUNTER — Telehealth: Payer: Self-pay | Admitting: *Deleted

## 2015-03-15 DIAGNOSIS — C61 Malignant neoplasm of prostate: Secondary | ICD-10-CM

## 2015-03-15 NOTE — Telephone Encounter (Signed)
Dr. Redmond School office requesting labwork results. Faxed to number provided.

## 2015-03-16 ENCOUNTER — Other Ambulatory Visit: Payer: Self-pay | Admitting: Internal Medicine

## 2015-03-16 NOTE — Telephone Encounter (Signed)
Rx request sent to pharmacy.  

## 2015-03-21 ENCOUNTER — Ambulatory Visit (HOSPITAL_COMMUNITY)
Admission: RE | Admit: 2015-03-21 | Discharge: 2015-03-21 | Disposition: A | Discharge status: Home / Self Care | Payer: Medicare Other | Source: Ambulatory Visit | Attending: Internal Medicine | Admitting: Internal Medicine

## 2015-03-21 DIAGNOSIS — I421 Obstructive hypertrophic cardiomyopathy: Secondary | ICD-10-CM | POA: Diagnosis not present

## 2015-03-29 ENCOUNTER — Ambulatory Visit (HOSPITAL_COMMUNITY)
Admission: RE | Admit: 2015-03-29 | Discharge: 2015-03-29 | Disposition: A | Discharge status: Home / Self Care | Payer: Medicare Other | Source: Ambulatory Visit | Attending: Urology | Admitting: Urology

## 2015-03-29 ENCOUNTER — Telehealth: Payer: Self-pay | Admitting: Internal Medicine

## 2015-03-29 ENCOUNTER — Encounter (HOSPITAL_COMMUNITY)
Admission: RE | Admit: 2015-03-29 | Discharge: 2015-03-29 | Disposition: A | Discharge status: Home / Self Care | Payer: Medicare Other | Source: Ambulatory Visit | Attending: Urology | Admitting: Urology

## 2015-03-29 DIAGNOSIS — C61 Malignant neoplasm of prostate: Secondary | ICD-10-CM | POA: Insufficient documentation

## 2015-03-29 MED ORDER — TECHNETIUM TC 99M MEDRONATE IV KIT
25.0000 | PACK | Freq: Once | INTRAVENOUS | Status: AC | PRN
Start: 2015-03-29 — End: 2015-03-29
  Administered 2015-03-29: 25.2 via INTRAVENOUS

## 2015-03-29 NOTE — Telephone Encounter (Signed)
Pt would like his echo results from 03-21-15 please.

## 2015-03-29 NOTE — Telephone Encounter (Signed)
Pt made aware of results no questions at this time.   

## 2015-03-29 NOTE — Telephone Encounter (Signed)
Echo looks similar to prior study in 2014 - not worse.  Dr. Debara Pickett

## 2015-03-29 NOTE — Telephone Encounter (Signed)
Routed to MD to review Echo report

## 2015-04-08 ENCOUNTER — Other Ambulatory Visit: Payer: Self-pay | Admitting: Internal Medicine

## 2015-04-17 ENCOUNTER — Other Ambulatory Visit: Payer: Self-pay | Admitting: Internal Medicine

## 2015-07-23 ENCOUNTER — Ambulatory Visit (INDEPENDENT_AMBULATORY_CARE_PROVIDER_SITE_OTHER): Payer: Medicare Other | Admitting: Internal Medicine

## 2015-07-23 ENCOUNTER — Encounter: Payer: Self-pay | Admitting: Internal Medicine

## 2015-07-23 VITALS — BP 122/66 | HR 55 | Ht 66.0 in | Wt 170.1 lb

## 2015-07-23 DIAGNOSIS — R011 Cardiac murmur, unspecified: Secondary | ICD-10-CM

## 2015-07-23 DIAGNOSIS — I2510 Atherosclerotic heart disease of native coronary artery without angina pectoris: Secondary | ICD-10-CM

## 2015-07-23 DIAGNOSIS — R001 Bradycardia, unspecified: Secondary | ICD-10-CM

## 2015-07-23 DIAGNOSIS — I5032 Chronic diastolic (congestive) heart failure: Secondary | ICD-10-CM | POA: Diagnosis not present

## 2015-07-23 NOTE — Patient Instructions (Signed)
Your physician wants you to follow-up in: 6 months with Dr. Debara Pickett. You will receive a reminder letter in the mail two months in advance. If you don't receive a letter, please call our office to schedule the follow-up appointment.  Your physician recommends that you continue on your current medications as directed. Please refer to the Current Medication list given to you today.

## 2015-07-23 NOTE — Progress Notes (Signed)
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. He has a history of hypertrophic obstructive cardiomyopathy, dyslipidemia and a history of prostate cancer in the past. He has a history of complex coronary artery disease. In 2005 he underwent cardiac catheterization and under ultimately had drug-eluting stenting x3 of the LAD and was noted to have a dominant right coronary moderate size circumflex artery. His last catheterization was in 2006 and since then has had lower stress test. He also has significant diastolic heart failure.  He was seen by myself in the emergency department last year for acute on chronic diastolic heart failure.  He is stage II diastolic dysfunction on his echo and responded to diuresis. Echo does have suggested that the areas CM and at least a 41 mm outflow tract gradient with valsalva.  This appears to be fairly stable. His weight on discharge from the hospital last year was 168 pounds and is stable today.  He denies any worsening shortness of breath and only reports a small amount of lower extremity swelling.  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 He reported some fatigue and lack of energy. He was noted to be bradycardic today with a long first degree AV block in the 40s. Blood pressure was 100/52. I recommended discontinuing his beta blocker. Since doing that he's had a marked improvement in his energy level. His blood pressure today was 120/82 when I rechecked it. He had not taken his lisinopril this morning however it is low dose at 2.5 mg. 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  He is generally without complaints. 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 He denies any chest pain or worsening shortness of breath. He has been battling  prostate cancer and underwent external radiation and still has an elevated PSA. He's currently taking diuretics only as  Needed, especially when he eats salt. He was on both beta blocker and calcium channel blocker however the beta blocker was discontinued by myself due to symptomatic bradycardia. He had marked improvement in his symptoms when his heart rate has come up into the 60s. EKG today shows sinus rhythm with a PVC.  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 He is without complaints. He denies any chest pain, worsening shortness of breath, presyncope or syncopal symptoms. His last office visit we ordered an echocardiogram which shows a stable LVOT gradient assistant with hypertrophic cardiomyopathy. He has EKG changes which are stable. Blood pressure is well-controlled today. His only notable finding was that recently his PSA had been elevated. He is concerned about this and has a follow-up with [name] in the near future.  PMHx:  Past Medical History  Diagnosis Date  . Coronary artery disease     Stents  . Hemorrhoids   . IHSS (idiopathic hypertrophic subaortic stenosis) ([place]) 2006  . Prostate cancer ([place]) 02/19/12    Adenocarcinoa,3+4=7,(PSA=8.0 on 11/11/11  . BPH (benign prostatic hyperplasia)   . Obstructive uropathy   . Cardiomyopathy ([name])   . PSA elevation 2011    5.4   . H/O cardiac catheterization     several with stents  . Heart murmur   . Acute CHF ([name]) 10/29/2012  . Family history of anesthesia complication     mother  had problems"   . Shortness of breath   . Hx of radiation therapy 04/29/12- 06/28/12    prostate 78 gray in 40 tx  . Hyperlipidemia     Past Surgical History  Procedure Laterality Date  . Hernia repair Bilateral     inguinal  . Colonoscopy    . Prostate biopsy  02/19/2012    Procedure: PROSTATE BIOPSY;  Surgeon: [name], MD;  Location: [facility];  Service: Urology;  Laterality: N/A;  . Band hemorrhoidectomy    . Transthoracic echocardiogram   08/26/2012    EF 55-65%, mod conc LVH, grade 1 diastolic dysfunction; ventricular septum mod increased; mild AV regurg; mod MR; tricuspid valve prolpase  . Nm myocar perf wall motion  07/03/2011    lexiscan myoview; perfusion defect in inferior myocardium (diaphragmatic attenuation), remaining myocardium with normal perfusion; post-stress EF 56%  . Cardiac catheterization  05/10/2004    CAD with crescendo angina, high-grade prox mid and mid-distal LAD with DES 3.5x30mm Cordis Cypher DES(Dr. [name])   . Cardiac catheterization  03/25/2005    DES X3 (3x5x18mm, 3x45mm, 2.5x54mm) of LAD, single-vessel diease with dominant right and moderate sized Cfx (Dr. [name])     FAMHx:  Family History  Problem Relation Age of Onset  . Colon cancer Mother 33  . Colon cancer Sister   . Brain cancer Daughter 57    deceased, diabetes  . Heart attack Father   . Stroke Maternal Grandmother   . Heart attack Brother 17  . Heart attack Brother     1st MI at 3    SOCHx:   reports that he quit smoking about 32 years ago. He quit smokeless tobacco use about 30 years ago. His smokeless tobacco use included Chew. He reports that he drinks alcohol. He reports that he does not use illicit drugs.  ALLERGIES:  Allergies  Allergen Reactions  . Compazine [Prochlorperazine] Hives    ROS: A comprehensive review of systems was negative.  HOME MEDS: Current Outpatient Prescriptions  Medication Sig Dispense Refill  . aspirin 81 MG tablet Take 81 mg by mouth daily.    Marland Kitchen atorvastatin (LIPITOR) 40 MG tablet Take 1 tablet (40 mg total) by mouth daily. 30 tablet 11  . clopidogrel (PLAVIX) 75 MG tablet TAKE 1 TABLET (75 MG TOTAL) BY MOUTH DAILY. 90 tablet 0  . fish oil-omega-3 fatty acids 1000 MG capsule Take 1 g by mouth daily.    . furosemide (LASIX) 40 MG tablet Take 0.5 tablets (20 mg total) by mouth daily as needed. 30 tablet 7  . lisinopril (PRINIVIL,ZESTRIL) 2.5 MG tablet TAKE 1 TABLET(S) BY MOUTH DAILY  30 tablet 4  . tamsulosin (FLOMAX) 0.4 MG CAPS capsule Take 1 capsule (0.4 mg total) by mouth daily. 30 capsule 5  . verapamil (VERELAN PM) 120 MG 24 hr capsule TAKE 1 CAPSULE (120 MG TOTAL) BY MOUTH AT BEDTIME. 90 capsule 2   No current facility-administered medications for this visit.    LABS/IMAGING: No results found for this or any previous visit (from the past 48 hour(s)). No results found.  VITALS: BP 122/66 mmHg  Pulse 55  Ht 5\' 6"  (1.676 m)  Wt 170 lb 1.6 oz (77.157 kg)  BMI 27.47 kg/m2  EXAM: General appearance: alert and no distress Neck: no carotid bruit, no JVD, thyroid not enlarged, symmetric, no tenderness/mass/nodules and Bilateral hearing loss Lungs: clear to auscultation bilaterally Heart: regular rate and rhythm, S1, S2 normal and systolic murmur: decreased with  valsalva 3/6, crescendo at 2nd right intercostal space Abdomen: soft, non-tender; bowel sounds normal; no masses,  no organomegaly Extremities: extremities normal, atraumatic, no cyanosis or edema Pulses: 2+ and symmetric Skin: Skin color, texture, turgor normal. No rashes or lesions Neurologic: Grossly normal Psych: Pleasant  EKG: Sinus rhythm with 1st degree AVB and PVC, inferolateral ST and T wave changes, stable  ASSESSMENT: 1. Hypertrophic structure cardiomyopathy-48 mmHg rest gradient (no report of Valsalva gradient) 2. Coronary artery disease with DES x3 the LAD 3. Hypertension 4. Dyslipidemia 5. Chronic diastolic heart failure 6. History of prostate cancer 7. Symptomatic bradycardia on B-blocker - improved  PLAN: 1.   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 Hypertension is controlled. His recent echo shows a higher rest gradient but no reported Valsalva change. He denies any chest pain. Overall he seems to be doing well. We'll plan to see him back in 6 months.  Pixie Casino, MD, [facility] Attending Cardiologist [place] C  Hilty 07/23/2015, 6:29 PM

## 2015-08-24 ENCOUNTER — Other Ambulatory Visit: Payer: Self-pay | Admitting: Internal Medicine

## 2015-08-24 NOTE — Telephone Encounter (Signed)
Rx request sent to pharmacy.  

## 2015-09-17 ENCOUNTER — Other Ambulatory Visit: Payer: Self-pay | Admitting: Urology

## 2015-09-17 DIAGNOSIS — C61 Malignant neoplasm of prostate: Secondary | ICD-10-CM

## 2015-09-27 ENCOUNTER — Other Ambulatory Visit: Payer: Self-pay | Admitting: *Deleted

## 2015-09-27 MED ORDER — LISINOPRIL 2.5 MG PO TABS: 2.5000 mg | ORAL_TABLET | Freq: Every day | ORAL | Status: DC

## 2015-11-06 ENCOUNTER — Encounter (HOSPITAL_COMMUNITY)
Admission: RE | Admit: 2015-11-06 | Discharge: 2015-11-06 | Disposition: A | Discharge status: Home / Self Care | Payer: Medicare Other | Source: Ambulatory Visit | Attending: Urology | Admitting: Urology

## 2015-11-06 DIAGNOSIS — C61 Malignant neoplasm of prostate: Secondary | ICD-10-CM | POA: Diagnosis not present

## 2015-11-06 MED ORDER — TECHNETIUM TC 99M MEDRONATE IV KIT
24.5000 | PACK | Freq: Once | INTRAVENOUS | Status: AC | PRN
  Administered 2015-11-06: 24.5 via INTRAVENOUS

## 2016-02-05 ENCOUNTER — Encounter: Payer: Self-pay | Admitting: Internal Medicine

## 2016-02-05 ENCOUNTER — Ambulatory Visit (INDEPENDENT_AMBULATORY_CARE_PROVIDER_SITE_OTHER): Payer: Medicare Other | Admitting: Internal Medicine

## 2016-02-05 VITALS — BP 118/64 | HR 57 | Ht 64.0 in | Wt 170.0 lb

## 2016-02-05 DIAGNOSIS — Z79899 Other long term (current) drug therapy: Secondary | ICD-10-CM | POA: Diagnosis not present

## 2016-02-05 DIAGNOSIS — I421 Obstructive hypertrophic cardiomyopathy: Secondary | ICD-10-CM

## 2016-02-05 DIAGNOSIS — D699 Hemorrhagic condition, unspecified: Secondary | ICD-10-CM

## 2016-02-05 DIAGNOSIS — E785 Hyperlipidemia, unspecified: Secondary | ICD-10-CM | POA: Diagnosis not present

## 2016-02-05 DIAGNOSIS — I2510 Atherosclerotic heart disease of native coronary artery without angina pectoris: Secondary | ICD-10-CM

## 2016-02-05 LAB — LIPID PANEL
Cholesterol: 116 mg/dL — ABNORMAL LOW (ref 125–200)
HDL: 50 mg/dL (ref >=40)
LDL CALC: 55 mg/dL (ref <130)
TRIGLYCERIDES: 53 mg/dL (ref <150)
Total CHOL/HDL Ratio: 2.3 Ratio (ref <=5.0)
VLDL: 11 mg/dL (ref <30)

## 2016-02-05 LAB — COMPREHENSIVE METABOLIC PANEL
ALT: 16 U/L (ref 9–46)
AST: 13 U/L (ref 10–35)
Albumin: 4.2 g/dL (ref 3.6–5.1)
Alkaline Phosphatase: 87 U/L (ref 40–115)
BUN: 14 mg/dL (ref 7–25)
CHLORIDE: 102 mmol/L (ref 98–110)
CO2: 27 mmol/L (ref 20–31)
Calcium: 9 mg/dL (ref 8.6–10.3)
Creat: 1.09 mg/dL (ref 0.70–1.18)
GLUCOSE: 82 mg/dL (ref 65–99)
POTASSIUM: 4.4 mmol/L (ref 3.5–5.3)
Sodium: 137 mmol/L (ref 135–146)
Total Bilirubin: 1 mg/dL (ref 0.2–1.2)
Total Protein: 6.8 g/dL (ref 6.1–8.1)

## 2016-02-05 NOTE — Progress Notes (Signed)
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. He has a history of hypertrophic obstructive cardiomyopathy, dyslipidemia and a history of prostate cancer in the past. He has a history of complex coronary artery disease. In 2005 he underwent cardiac catheterization and under ultimately had drug-eluting stenting x3 of the LAD and was noted to have a dominant right coronary moderate size circumflex artery. His last catheterization was in 2006 and since then has had lower stress test. He also has significant diastolic heart failure.  He was seen by myself in the emergency department last year for acute on chronic diastolic heart failure.  He is stage II diastolic dysfunction on his echo and responded to diuresis. Echo does have suggested that the areas CM and at least a 41 mm outflow tract gradient with valsalva.  This appears to be fairly stable. His weight on discharge from the hospital last year was 168 pounds and is stable today.  He denies any worsening shortness of breath and only reports a small amount of lower extremity swelling.  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 He reported some fatigue and lack of energy. He was noted to be bradycardic today with a long first degree AV block in the 40s. Blood pressure was 100/52. I recommended discontinuing his beta blocker. Since doing that he's had a marked improvement in his energy level. His blood pressure today was 120/82 when I rechecked it. He had not taken his lisinopril this morning however it is low dose at 2.5 mg. 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  He is generally without complaints. 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 He denies any chest pain or worsening shortness of breath. He has been battling prostate  cancer and underwent external radiation and still has an elevated PSA. He's currently taking diuretics only as  Needed, especially when he eats salt. He was on both beta blocker and calcium channel blocker however the beta blocker was discontinued by myself due to symptomatic bradycardia. He had marked improvement in his symptoms when his heart rate has come up into the 60s. EKG today shows sinus rhythm with a PVC.  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 He is without complaints. He denies any chest pain, worsening shortness of breath, presyncope or syncopal symptoms. His last office visit we ordered an echocardiogram which shows a stable LVOT gradient assistant with hypertrophic cardiomyopathy. He has EKG changes which are stable. Blood pressure is well-controlled today. His only notable finding was that recently his PSA had been elevated. He is concerned about this and has a follow-up with Dr. Jeffie Mclean in the near future.  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 He is main concern is that he is bleeding very easily. He apparently caught his left elbow on a prior and this was covered with a bandage today. The gauze bandages completely soaked with blood although he had a very small puncture wound. I redressed this and the office and we held pressure on it. He says this is been a problem since been on aspirin and Plavix. He says he does get some pressure in his chest particularly after eating. He denies any chest pain, presyncope or syncopal symptoms with exertion. Of note we've been following  his echocardiogram which showed a stable LVOT gradient in the past. His last echo was 2016.  PMHx:  Past Medical History:  Diagnosis Date  . Acute CHF ([place]) 10/29/2012  . BPH (benign prostatic hyperplasia)   . Cardiomyopathy ([place])   . Coronary artery disease    Stents  . Family history of anesthesia complication    mother had problems"   . H/O cardiac catheterization    several with stents  . Heart  murmur   . Hemorrhoids   . Hx of radiation therapy 04/29/12- 06/28/12   prostate 78 gray in 40 tx  . Hyperlipidemia   . IHSS (idiopathic hypertrophic subaortic stenosis) ([place]) 2006  . Obstructive uropathy   . Prostate cancer ([place]) 02/19/12   Adenocarcinoa,3+4=7,(PSA=8.0 on 11/11/11  . PSA elevation 2011   5.4   . Shortness of breath     Past Surgical History:  Procedure Laterality Date  . BAND HEMORRHOIDECTOMY    . CARDIAC CATHETERIZATION  05/10/2004   CAD with crescendo angina, high-grade prox mid and mid-distal LAD with DES 3.5x73mm Cordis Cypher DES(Dr. [name])   . CARDIAC CATHETERIZATION  03/25/2005   DES X3 (3x5x34mm, 3x55mm, 2.5x42mm) of LAD, single-vessel diease with dominant right and moderate sized Cfx (Dr. [name])   . COLONOSCOPY    . HERNIA REPAIR Bilateral    inguinal  . NM MYOCAR PERF WALL MOTION  07/03/2011   lexiscan myoview; perfusion defect in inferior myocardium (diaphragmatic attenuation), remaining myocardium with normal perfusion; post-stress EF 56%  . PROSTATE BIOPSY  02/19/2012   Procedure: PROSTATE BIOPSY;  Surgeon: [name], MD;  Location: [facility];  Service: Urology;  Laterality: N/A;  . TRANSTHORACIC ECHOCARDIOGRAM  08/26/2012   EF 55-65%, mod conc LVH, grade 1 diastolic dysfunction; ventricular septum mod increased; mild AV regurg; mod MR; tricuspid valve prolpase    FAMHx:  Family History  Problem Relation Age of Onset  . Colon cancer Mother 46  . Brain cancer Daughter 35    deceased, diabetes  . Heart attack Father   . Stroke Maternal Grandmother   . Colon cancer Sister   . Heart attack Brother 71  . Heart attack Brother     1st MI at 60    SOCHx:   reports that he quit smoking about 32 years ago. He quit smokeless tobacco use about 30 years ago. His smokeless tobacco use included Chew. He reports that he drinks alcohol. He reports that he does not use drugs.  ALLERGIES:  Allergies  Allergen Reactions  . Compazine  [Prochlorperazine] Hives    ROS: A comprehensive review of systems was negative.  HOME MEDS: Current Outpatient Prescriptions  Medication Sig Dispense Refill  . aspirin 81 MG tablet Take 81 mg by mouth daily.    Marland Kitchen atorvastatin (LIPITOR) 40 MG tablet Take 1 tablet (40 mg total) by mouth daily. 30 tablet 11  . fish oil-omega-3 fatty acids 1000 MG capsule Take 1 g by mouth daily.    . furosemide (LASIX) 40 MG tablet Take 0.5 tablets (20 mg total) by mouth daily as needed. 30 tablet 7  . lisinopril (PRINIVIL,ZESTRIL) 2.5 MG tablet Take 1 tablet (2.5 mg total) by mouth daily. 30 tablet 5  . tamsulosin (FLOMAX) 0.4 MG CAPS capsule Take 1 capsule (0.4 mg total) by mouth daily. 30 capsule 5  . verapamil (VERELAN PM) 120 MG 24 hr capsule TAKE 1 CAPSULE (120 MG TOTAL) BY MOUTH AT BEDTIME. 90 capsule 2   No current facility-administered  medications for this visit.     LABS/IMAGING: No results found for this or any previous visit (from the past 48 hour(s)). No results found.  VITALS: BP 118/64   Pulse (!) 57   Ht 5\' 4"  (1.626 m)   Wt 170 lb (77.1 kg)   BMI 29.18 kg/m   EXAM: General appearance: alert and no distress Neck: no carotid bruit, no JVD, thyroid not enlarged, symmetric, no tenderness/mass/nodules and Bilateral hearing loss Lungs: clear to auscultation bilaterally Heart: regular rate and rhythm, S1, S2 normal and systolic murmur: decreased with valsalva 3/6, crescendo at 2nd right intercostal space Abdomen: soft, non-tender; bowel sounds normal; no masses,  no organomegaly Extremities: extremities normal, atraumatic, no cyanosis or edema Pulses: 2+ and symmetric Skin: Skin color, texture, turgor normal. No rashes or lesions Neurologic: Grossly normal Psych: Pleasant  EKG: Sinus bradycardia with first-degree AV block, anterior infarct pattern, ST and T-wave abnormalities inferolaterally  ASSESSMENT: 1. Hypertrophic structure cardiomyopathy-48 mmHg rest gradient (no report  of Valsalva gradient) 2. Coronary artery disease with DES x3 the LAD (2004) 3. Hypertension 4. Dyslipidemia 5. Chronic diastolic heart failure 6. History of prostate cancer 7. Symptomatic bradycardia on B-blocker - improved  PLAN: 1.   Dr. Dahlia Mclean reports recently he's had some very easy bleeding. This is likely due to being on dual antiplatelet therapy. His LAD stenting was remote in 2004 and therefore he could likely come off of Plavix due to his significant bleeding problems. He should continue aspirin. I like to repeat his echo to see this been any change in his gradient with regards to hypertrophic cardiomyopathy. I will also check some laboratory work including a lipid profile comprehensive metabolic profile.  I'll contact him with those results but otherwise plan to follow-up in 6 months.  Pixie Casino, MD, [facility] Attending Cardiologist [place] 02/05/2016, 5:28 PM

## 2016-02-05 NOTE — Patient Instructions (Signed)
Your physician has recommended you make the following change in your medication: STOP plavix  Your physician recommends that you return for lab work FASTING to check cholesterol/comprehensive metabolic panel   Your physician has requested that you have an echocardiogram @ [address] - [address]rd [address]. Echocardiography is a painless test that uses sound waves to create images of your heart. It provides your doctor with information about the size and shape of your heart and how well your heart's chambers and valves are working. This procedure takes approximately one hour. There are no restrictions for this procedure.  Your physician wants you to follow-up in: 6 months with Dr. Debara Pickett. You will receive a reminder letter in the mail two months in advance. If you don't receive a letter, please call our office to schedule the follow-up appointment.

## 2016-03-28 ENCOUNTER — Other Ambulatory Visit: Payer: Self-pay | Admitting: Internal Medicine

## 2016-03-28 NOTE — Telephone Encounter (Signed)
Rx(s) sent to pharmacy electronically.  

## 2016-04-01 ENCOUNTER — Other Ambulatory Visit (HOSPITAL_COMMUNITY): Payer: Medicare Other

## 2016-04-14 ENCOUNTER — Other Ambulatory Visit: Payer: Self-pay

## 2016-04-14 ENCOUNTER — Ambulatory Visit (HOSPITAL_COMMUNITY): Payer: Medicare Other | Attending: Cardiovascular Disease

## 2016-04-14 DIAGNOSIS — I421 Obstructive hypertrophic cardiomyopathy: Secondary | ICD-10-CM | POA: Diagnosis not present

## 2016-05-18 ENCOUNTER — Other Ambulatory Visit: Payer: Self-pay | Admitting: Internal Medicine

## 2016-05-20 NOTE — Telephone Encounter (Signed)
Rx(s) sent to pharmacy electronically.  

## 2016-08-04 ENCOUNTER — Ambulatory Visit (INDEPENDENT_AMBULATORY_CARE_PROVIDER_SITE_OTHER): Payer: Medicare Other | Admitting: Internal Medicine

## 2016-08-04 VITALS — BP 117/70 | HR 56

## 2016-08-04 DIAGNOSIS — I5032 Chronic diastolic (congestive) heart failure: Secondary | ICD-10-CM | POA: Diagnosis not present

## 2016-08-04 DIAGNOSIS — I421 Obstructive hypertrophic cardiomyopathy: Secondary | ICD-10-CM | POA: Diagnosis not present

## 2016-08-04 DIAGNOSIS — E785 Hyperlipidemia, unspecified: Secondary | ICD-10-CM

## 2016-08-04 LAB — LIPID PANEL
Cholesterol: 100 mg/dL (ref <200)
HDL: 48 mg/dL (ref >40)
LDL CALC: 42 mg/dL (ref <100)
Total CHOL/HDL Ratio: 2.1 Ratio (ref <5.0)
Triglycerides: 51 mg/dL (ref <150)
VLDL: 10 mg/dL (ref <30)

## 2016-08-04 NOTE — Patient Instructions (Addendum)
Your physician wants you to follow-up in: November 2018 (after echocardiogram). You will receive a reminder letter in the mail two months in advance. If you don't receive a letter, please call our office to schedule the follow-up appointment.

## 2016-08-04 NOTE — Progress Notes (Signed)
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. He has a history of hypertrophic obstructive cardiomyopathy, dyslipidemia and a history of prostate cancer in the past. He has a history of complex coronary artery disease. In 2005 he underwent cardiac catheterization and under ultimately had drug-eluting stenting x3 of the LAD and was noted to have a dominant right coronary moderate size circumflex artery. His last catheterization was in 2006 and since then has had lower stress test. He also has significant diastolic heart failure.  He was seen by myself in the emergency department last year for acute on chronic diastolic heart failure.  He is stage II diastolic dysfunction on his echo and responded to diuresis. Echo does have suggested that the areas CM and at least a 41 mm outflow tract gradient with valsalva.  This appears to be fairly stable. His weight on discharge from the hospital last year was 168 pounds and is stable today.  He denies any worsening shortness of breath and only reports a small amount of lower extremity swelling.  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 He reported some fatigue and lack of energy. He was noted to be bradycardic today with a long first degree AV block in the 40s. Blood pressure was 100/52. I recommended discontinuing his beta blocker. Since doing that he's had a marked improvement in his energy level. His blood pressure today was 120/82 when I rechecked it. He had not taken his lisinopril this morning however it is low dose at 2.5 mg. 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  He is generally without complaints. 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 He denies any chest pain or worsening shortness of breath. He has been battling prostate  cancer and underwent external radiation and still has an elevated PSA. He's currently taking diuretics only as  Needed, especially when he eats salt. He was on both beta blocker and calcium channel blocker however the beta blocker was discontinued by myself due to symptomatic bradycardia. He had marked improvement in his symptoms when his heart rate has come up into the 60s. EKG today shows sinus rhythm with a PVC.  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 He is without complaints. He denies any chest pain, worsening shortness of breath, presyncope or syncopal symptoms. His last office visit we ordered an echocardiogram which shows a stable LVOT gradient assistant with hypertrophic cardiomyopathy. He has EKG changes which are stable. Blood pressure is well-controlled today. His only notable finding was that recently his PSA had been elevated. He is concerned about this and has a follow-up with [name] in the near future.  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 He is main concern is that he is bleeding very easily. He apparently caught his left elbow on a prior and this was covered with a bandage today. The gauze bandages completely soaked with blood although he had a very small puncture wound. I redressed this and the office and we held pressure on it. He says this is been a problem since been on aspirin and Plavix. He says he does get some pressure in his chest particularly after eating. He denies any chest pain, presyncope or syncopal symptoms with exertion. Of note we've been following  his echocardiogram which showed a stable LVOT gradient in the past. His last echo was 2016.  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 Over the past 6 months he's done very well. 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 He had an echo in November 2017 which did show an increased LVOT gradient 64 mmHg. Despite this he is not  having any new symptoms. Laboratory work is been stable. Cholesterol is at goal with last LDL 55 on atorvastatin 40 mg.  PMHx:  Past Medical History:  Diagnosis Date  . Acute CHF ([place]) 10/29/2012  . BPH (benign prostatic hyperplasia)   . Cardiomyopathy ([place])   . Coronary artery disease    Stents  . Family history of anesthesia complication    mother had problems"   . H/O cardiac catheterization    several with stents  . Heart murmur   . Hemorrhoids   . Hx of radiation therapy 04/29/12- 06/28/12   prostate 78 gray in 40 tx  . Hyperlipidemia   . IHSS (idiopathic hypertrophic subaortic stenosis) ([place]) 2006  . Obstructive uropathy   . Prostate cancer ([place]) 02/19/12   Adenocarcinoa,3+4=7,(PSA=8.0 on 11/11/11  . PSA elevation 2011   5.4   . Shortness of breath     Past Surgical History:  Procedure Laterality Date  . BAND HEMORRHOIDECTOMY    . CARDIAC CATHETERIZATION  05/10/2004   CAD with crescendo angina, high-grade prox mid and mid-distal LAD with DES 3.5x12mm Cordis Cypher DES(Dr. [name])   . CARDIAC CATHETERIZATION  03/25/2005   DES X3 (3x5x32mm, 3x73mm, 2.5x51mm) of LAD, single-vessel diease with dominant right and moderate sized Cfx (Dr. [name])   . COLONOSCOPY    . HERNIA REPAIR Bilateral    inguinal  . NM MYOCAR PERF WALL MOTION  07/03/2011   lexiscan myoview; perfusion defect in inferior myocardium (diaphragmatic attenuation), remaining myocardium with normal perfusion; post-stress EF 56%  . PROSTATE BIOPSY  02/19/2012   Procedure: PROSTATE BIOPSY;  Surgeon: [name], MD;  Location: [facility];  Service: Urology;  Laterality: N/A;  . TRANSTHORACIC ECHOCARDIOGRAM  08/26/2012   EF 55-65%, mod conc LVH, grade 1 diastolic dysfunction; ventricular septum mod increased; mild AV regurg; mod MR; tricuspid valve prolpase    FAMHx:  Family History  Problem Relation Age of Onset  . Colon cancer Mother 45  . Brain cancer Daughter 27    deceased, diabetes  . Heart  attack Father   . Stroke Maternal Grandmother   . Colon cancer Sister   . Heart attack Brother 38  . Heart attack Brother     1st MI at 61    SOCHx:   reports that he quit smoking about 33 years ago. He quit smokeless tobacco use about 31 years ago. His smokeless tobacco use included Chew. He reports that he drinks alcohol. He reports that he does not use drugs.  ALLERGIES:  Allergies  Allergen Reactions  . Compazine [Prochlorperazine] Hives    ROS: Pertinent items noted in HPI and remainder of comprehensive ROS otherwise negative.  HOME MEDS: Current Outpatient Prescriptions  Medication Sig Dispense Refill  . aspirin 81 MG tablet Take 81 mg by mouth daily.    Marland Kitchen atorvastatin (LIPITOR) 40 MG tablet Take 1 tablet (40 mg total) by mouth daily. 30 tablet 10  . fish oil-omega-3 fatty acids 1000 MG capsule Take 1 g by mouth daily.    . furosemide (  LASIX) 40 MG tablet Take 0.5 tablets (20 mg total) by mouth daily as needed. 30 tablet 7  . lisinopril (PRINIVIL,ZESTRIL) 2.5 MG tablet Take 1 tablet (2.5 mg total) by mouth daily. 30 tablet 5  . tamsulosin (FLOMAX) 0.4 MG CAPS capsule Take 1 capsule (0.4 mg total) by mouth daily. 30 capsule 5  . verapamil (VERELAN PM) 120 MG 24 hr capsule TAKE 1 CAPSULE (120 MG TOTAL) BY MOUTH AT BEDTIME. 90 capsule 2   No current facility-administered medications for this visit.     LABS/IMAGING: No results found for this or any previous visit (from the past 48 hour(s)). No results found.  VITALS: BP 117/70   Pulse (!) 56   EXAM: General appearance: alert and no distress Neck: no carotid bruit, no JVD, thyroid not enlarged, symmetric, no tenderness/mass/nodules and Bilateral hearing loss Lungs: clear to auscultation bilaterally Heart: regular rate and rhythm, S1, S2 normal and systolic murmur: decreased with valsalva 3/6, crescendo at 2nd right intercostal space Abdomen: soft, non-tender; bowel sounds normal; no masses,  no  organomegaly Extremities: extremities normal, atraumatic, no cyanosis or edema Pulses: 2+ and symmetric Skin: Skin color, texture, turgor normal. No rashes or lesions Neurologic: Grossly normal Psych: Pleasant  EKG: Sinus bradycardia first degree AV block at 56   ASSESSMENT: 1. Hypertrophic structure cardiomyopathy-27 mmHg rest gradient, increased to 64 mmHg with Valsalva 2. Coronary artery disease with DES x3 the LAD (2004) 3. Hypertension 4. Dyslipidemia 5. Chronic diastolic heart failure 6. History of prostate cancer 7. Symptomatic bradycardia on B-blocker - improved  PLAN: 1.   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 His echo showed a rest gradient of 27 mmHg with increased to 64 mmHg after Valsalva. He does not report any symptoms related to that. We'll continue to follow this with annual echocardiograms. Heart rate is low in the 50s and blood pressure well controlled. We'll recheck a lipid profile today.  I'll contact him with those results but otherwise plan to follow-up in 8 months after repeat echo.  Pixie Casino, MD, [facility] Attending Cardiologist [place] Ilisha Blust 08/04/2016, 10:36 AM

## 2016-08-05 ENCOUNTER — Encounter: Payer: Self-pay | Admitting: Internal Medicine

## 2016-08-25 ENCOUNTER — Other Ambulatory Visit: Payer: Self-pay | Admitting: Urology

## 2016-08-25 DIAGNOSIS — C61 Malignant neoplasm of prostate: Secondary | ICD-10-CM

## 2016-09-29 ENCOUNTER — Other Ambulatory Visit: Payer: Self-pay | Admitting: Internal Medicine

## 2016-10-02 ENCOUNTER — Encounter (HOSPITAL_COMMUNITY)
Admission: RE | Admit: 2016-10-02 | Discharge: 2016-10-02 | Disposition: A | Discharge status: Home / Self Care | Payer: Medicare Other | Source: Ambulatory Visit | Attending: Urology | Admitting: Urology

## 2016-10-02 DIAGNOSIS — C61 Malignant neoplasm of prostate: Secondary | ICD-10-CM | POA: Diagnosis not present

## 2016-10-02 MED ORDER — TECHNETIUM TC 99M MEDRONATE IV KIT
25.0000 | PACK | Freq: Once | INTRAVENOUS | Status: AC | PRN
  Administered 2016-10-02: 25 via INTRAVENOUS

## 2016-12-01 ENCOUNTER — Telehealth: Payer: Self-pay | Admitting: Oncology

## 2016-12-01 NOTE — Telephone Encounter (Signed)
Spoke with patient re 7/19 new patient appointment with Dr. Alen Blew at 11 am to arrive 10;30 am. demographic and insurance information confirmed.

## 2016-12-24 ENCOUNTER — Telehealth: Payer: Self-pay | Admitting: *Deleted

## 2016-12-24 NOTE — Telephone Encounter (Signed)
Called patient to remind him of his New patient appointment with Dr. Alen Blew at 10:30 am. Patient verbalized understanding.

## 2016-12-25 ENCOUNTER — Ambulatory Visit (HOSPITAL_BASED_OUTPATIENT_CLINIC_OR_DEPARTMENT_OTHER): Payer: Medicare Other | Admitting: Oncology

## 2016-12-25 VITALS — BP 132/72 | HR 57 | Temp 97.8°F | Resp 18 | Ht 64.0 in | Wt 167.8 lb

## 2016-12-25 DIAGNOSIS — C61 Malignant neoplasm of prostate: Secondary | ICD-10-CM | POA: Diagnosis not present

## 2016-12-25 NOTE — Progress Notes (Signed)
Reason for Referral: Prostate cancer.   HPI: Logan Mclean is a 76 year old gentleman with history of coronary artery disease, congestive heart failure as well as a few other comorbid conditions. He was diagnosed with prostate cancer in 2013. His PSA was 8.0 and a biopsy showed a Gleason score of 3+4 equals 7. At that time he underwent definitive therapy with radiation in the care of Dr. Sondra Come that was completed in 2014. He remains under active surveillance with [name] and his PSA started to rise. In April 2018 his PSA was 103. In October 2017 was 33. In May 2017 was 29. His PSA was around 15 in February 2016. His staging workup including a CT scan and bone scan did show sclerotic bony lesions in T11 and possible in the calvarium. He started androgen deprivation therapy under the care of [name] with [facility]. After 3 injections in May 2018, June 2018 and July 2018 his PSA decreased to 26. Clinically he is asymptomatic at this time. He doesn't report some hot flashes associated with androgen deprivation. He denied any bone pain, pathological fractures or constitutional symptoms. He denied any urinary difficulties. 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  He does not report any headaches, blurry vision, syncope or seizures. He does not report any fevers, chills or sweats. He does not report any cough, wheezing or hemoptysis. He does not report any nausea, vomiting or abdominal pain. He does not report any frequency urgency or hesitancy. He does not report any skeletal complaints. She does not report any lymphadenopathy or petechiae. Remaining review of system is unremarkable.   Past Medical History:  Diagnosis Date  . Acute CHF ([place]) 10/29/2012  . BPH (benign prostatic hyperplasia)   . Cardiomyopathy ([place])   . Coronary artery disease    Stents  . Family history of anesthesia complication    mother had problems"   . H/O cardiac catheterization    several with stents  . Heart  murmur   . Hemorrhoids   . Hx of radiation therapy 04/29/12- 06/28/12   prostate 78 gray in 40 tx  . Hyperlipidemia   . IHSS (idiopathic hypertrophic subaortic stenosis) ([place]) 2006  . Obstructive uropathy   . Prostate cancer ([place]) 02/19/12   Adenocarcinoa,3+4=7,(PSA=8.0 on 11/11/11  . PSA elevation 2011   5.4   . Shortness of breath   :  Past Surgical History:  Procedure Laterality Date  . BAND HEMORRHOIDECTOMY    . CARDIAC CATHETERIZATION  05/10/2004   CAD with crescendo angina, high-grade prox mid and mid-distal LAD with DES 3.5x12mm Cordis Cypher DES(Dr. [name])   . CARDIAC CATHETERIZATION  03/25/2005   DES X3 (3x5x105mm, 3x53mm, 2.5x58mm) of LAD, single-vessel diease with dominant right and moderate sized Cfx (Dr. [name])   . COLONOSCOPY    . HERNIA REPAIR Bilateral    inguinal  . NM MYOCAR PERF WALL MOTION  07/03/2011   lexiscan myoview; perfusion defect in inferior myocardium (diaphragmatic attenuation), remaining myocardium with normal perfusion; post-stress EF 56%  . PROSTATE BIOPSY  02/19/2012   Procedure: PROSTATE BIOPSY;  Surgeon: [name], MD;  Location: [facility];  Service: Urology;  Laterality: N/A;  . TRANSTHORACIC ECHOCARDIOGRAM  08/26/2012   EF 55-65%, mod conc LVH, grade 1 diastolic dysfunction; ventricular septum mod increased; mild AV regurg; mod MR; tricuspid valve prolpase  :   Current Outpatient Prescriptions:  .  aspirin 81 MG tablet, Take 81 mg by mouth daily., Disp: , Rfl:  .  atorvastatin (LIPITOR) 40 MG tablet, Take 1 tablet (40 mg total) by mouth daily., Disp: 30 tablet, Rfl: 10 .  fish oil-omega-3 fatty acids 1000 MG capsule, Take 1 g by mouth daily., Disp: , Rfl:  .  furosemide (LASIX) 40 MG tablet, Take 0.5 tablets (20 mg total) by mouth daily as needed., Disp: 30 tablet, Rfl: 7 .  lisinopril (PRINIVIL,ZESTRIL) 2.5 MG tablet, TAKE ONE TABLET BY MOUTH DAILY, Disp: 30 tablet, Rfl: 4 .  tamsulosin (FLOMAX) 0.4 MG CAPS capsule, Take 1 capsule  (0.4 mg total) by mouth daily., Disp: 30 capsule, Rfl: 5 .  verapamil (VERELAN PM) 120 MG 24 hr capsule, TAKE 1 CAPSULE (120 MG TOTAL) BY MOUTH AT BEDTIME., Disp: 90 capsule, Rfl: 2:  Allergies  Allergen Reactions  . Compazine [Prochlorperazine] Hives  :  Family History  Problem Relation Age of Onset  . Colon cancer Mother 49  . Brain cancer Daughter 21       deceased, diabetes  . Heart attack Father   . Stroke Maternal Grandmother   . Colon cancer Sister   . Heart attack Brother 38  . Heart attack Brother        1st MI at 81  :  Social History   Social History  . Marital status: Married    Spouse name: N/A  . Number of children: 1  . Years of education: N/A   Occupational History  .  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 Smoking status: Former Smoker    Quit date: 06/21/1983  . Smokeless tobacco: Former Systems developer    Types: Chew    Quit date: 06/09/1985  . Alcohol use Yes     Comment: occasional  . Drug use: No  . Sexual activity: No   Other Topics Concern  . Not on file   Social History Narrative  . No narrative on file  :  Pertinent items are noted in HPI.  Exam: Blood pressure 132/72, pulse (!) 57, temperature 97.8 F (36.6 C), temperature source Oral, resp. rate 18, height 5\' 4"  (1.626 m), weight 167 lb 12.8 oz (76.1 kg), SpO2 97 %. ECOG 1 General appearance: alert and cooperative appeared without distress. Throat: No oral thrush. Neck: no adenopathy Back: negative Resp: clear to auscultation bilaterally Cardio: regular rate and rhythm, S1, S2 normal, no murmur, click, rub or gallop GI: soft, non-tender; bowel sounds normal; no masses,  no organomegaly Extremities: extremities normal, atraumatic, no cyanosis or edema Lymph nodes: Cervical, supraclavicular, and axillary nodes normal.  CBC    Component Value Date/Time   WBC 6.8 11/03/2012 1450   RBC 3.93 (L) 11/03/2012 1450   HGB 12.4 (L) 11/03/2012 1450   HCT  35.7 (L) 11/03/2012 1450   PLT 263 11/03/2012 1450   MCV 90.8 11/03/2012 1450   MCH 31.6 11/03/2012 1450   MCHC 34.7 11/03/2012 1450   RDW 13.2 11/03/2012 1450   LYMPHSABS 2.5 11/03/2012 1450   MONOABS 0.5 11/03/2012 1450   EOSABS 0.3 11/03/2012 1450   BASOSABS 0.1 11/03/2012 1450     Chemistry      Component Value Date/Time   NA 137 02/05/2016 1213   K 4.4 02/05/2016 1213   CL 102 02/05/2016 1213   CO2 27 02/05/2016 1213   BUN 14 02/05/2016 1213   CREATININE 1.09 02/05/2016 1213      Component Value Date/Time   CALCIUM 9.0 02/05/2016 1213   ALKPHOS 87 02/05/2016  1213   AST 13 02/05/2016 1213   ALT 16 02/05/2016 1213   BILITOT 1.0 02/05/2016 1213     ADDENDUM: After further review of the recent CT, the increased uptake at T11 could be secondary to a subtle compression fracture at this level. The lucent lesion at T11 may be atypical for metastatic prostate cancer. Findings are still concerning for possible metastatic disease based on the new foci in the calvarium. Recommend further characterization of the T11 finding with MRI with and without contrast. If additional imaging of the calvarium is needed, this could be performed with a CT or skull films.   Assessment and Plan:   76 year old gentleman with the following issues:  1. Prostate cancer diagnosed in 2013. He presented with a PSA of 8 and a Gleason score 3+4 = 7. He received definitive radiation therapy under the care of Dr. Sondra Come completed in 2014. He developed a gradual increase in his PSA although the last few years with a PSA of 103 in April 2018. He started androgen deprivation in May 2018 with a PSA response in June 2018 of 26. Staging workup did show very subtle lucencies in T11 and the calvarium.  The natural course of this disease was discussed today with the patient and his wife. He appears to have hormone sensitive advanced prostate cancer. I explained to him the standard of care is androgen deprivation  therapy at this time with possible additional therapy. The rationale for using Zytiga and systemic chemotherapy was discussed today. I see no role for systemic chemotherapy at this time given the low volume disease and lack of visceral metastasis.  The risks and benefits of adding Zytiga was reviewed today. I feel that given his low volume disease, Gleason score of 7, the gradual increase in his PSA he would benefit less from Zytiga at this time. The option of doing Zytiga at this time versus doing it at a later date when he developed castration resistance was discussed today. We also discussed the side effects associated with this medication as well as the cost burden.  After discussion today, I have recommended deferring doing Zytiga at this time. I recommended continuing androgen deprivation therapy indefinitely and using Zytiga if he developed castration resistant disease.  2. Follow-up: I'm happy to see him in the future when he develops a rise in the PSA in the future and at that time Logan Mclean will be indicated.

## 2017-01-15 ENCOUNTER — Other Ambulatory Visit: Payer: Self-pay

## 2017-01-15 MED ORDER — LISINOPRIL 2.5 MG PO TABS: 2.5000 mg | ORAL_TABLET | Freq: Every day | ORAL | 5 refills | Status: DC

## 2017-02-05 ENCOUNTER — Other Ambulatory Visit: Payer: Self-pay | Admitting: Internal Medicine

## 2017-02-18 ENCOUNTER — Other Ambulatory Visit: Payer: Self-pay | Admitting: Internal Medicine

## 2017-05-05 ENCOUNTER — Other Ambulatory Visit: Payer: Self-pay

## 2017-05-05 ENCOUNTER — Ambulatory Visit (HOSPITAL_COMMUNITY): Payer: Medicare Other | Attending: Cardiology

## 2017-05-05 DIAGNOSIS — I420 Dilated cardiomyopathy: Secondary | ICD-10-CM | POA: Diagnosis not present

## 2017-05-05 DIAGNOSIS — I5030 Unspecified diastolic (congestive) heart failure: Secondary | ICD-10-CM | POA: Diagnosis not present

## 2017-05-05 DIAGNOSIS — I080 Rheumatic disorders of both mitral and aortic valves: Secondary | ICD-10-CM | POA: Insufficient documentation

## 2017-05-05 DIAGNOSIS — I421 Obstructive hypertrophic cardiomyopathy: Secondary | ICD-10-CM | POA: Diagnosis present

## 2017-05-11 ENCOUNTER — Other Ambulatory Visit: Payer: Self-pay | Admitting: Internal Medicine

## 2017-05-11 DIAGNOSIS — E785 Hyperlipidemia, unspecified: Secondary | ICD-10-CM

## 2017-05-21 ENCOUNTER — Encounter: Payer: Self-pay | Admitting: Internal Medicine

## 2017-05-21 ENCOUNTER — Ambulatory Visit: Payer: Medicare Other | Admitting: Internal Medicine

## 2017-05-21 VITALS — BP 114/60 | HR 62 | Ht 66.0 in | Wt 167.8 lb

## 2017-05-21 DIAGNOSIS — I421 Obstructive hypertrophic cardiomyopathy: Secondary | ICD-10-CM | POA: Diagnosis not present

## 2017-05-21 DIAGNOSIS — I5032 Chronic diastolic (congestive) heart failure: Secondary | ICD-10-CM | POA: Diagnosis not present

## 2017-05-21 DIAGNOSIS — I2510 Atherosclerotic heart disease of native coronary artery without angina pectoris: Secondary | ICD-10-CM | POA: Diagnosis not present

## 2017-05-21 NOTE — Patient Instructions (Signed)
Your physician recommends that you return for lab work TODAY   Your physician wants you to follow-up in: ONE YEAR with Dr. Hilty. You will receive a reminder letter in the mail two months in advance. If you don't receive a letter, please call our office to schedule the follow-up appointment.   

## 2017-05-22 LAB — LIPID PANEL
Chol/HDL Ratio: 2.2 ratio (ref 0.0–5.0)
Cholesterol, Total: 110 mg/dL (ref 100–199)
HDL: 51 mg/dL (ref >39)
LDL CALC: 44 mg/dL (ref 0–99)
Triglycerides: 75 mg/dL (ref 0–149)
VLDL CHOLESTEROL CAL: 15 mg/dL (ref 5–40)

## 2017-05-23 ENCOUNTER — Encounter: Payer: Self-pay | Admitting: Internal Medicine

## 2017-05-23 NOTE — Progress Notes (Signed)
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 Rollene Mclean. He has a history of hypertrophic obstructive cardiomyopathy, dyslipidemia and a history of prostate cancer in the past. He has a history of complex coronary artery disease. In 2005 he underwent cardiac catheterization and under ultimately had drug-eluting stenting x3 of the LAD and was noted to have a dominant right coronary moderate size circumflex artery. His last catheterization was in 2006 and since then has had lower stress test. He also has significant diastolic heart failure.  He was seen by myself in the emergency department last year for acute on chronic diastolic heart failure.  He is stage II diastolic dysfunction on his echo and responded to diuresis. Echo does have suggested that the areas CM and at least a 41 mm outflow tract gradient with valsalva.  This appears to be fairly stable. His weight on discharge from the hospital last year was 168 pounds and is stable today.  He denies any worsening shortness of breath and only reports a small amount of lower extremity swelling.  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 He reported some fatigue and lack of energy. He was noted to be bradycardic today with a long first degree AV block in the 40s. Blood pressure was 100/52. I recommended discontinuing his beta blocker. Since doing that he's had a marked improvement in his energy level. His blood pressure today was 120/82 when I rechecked it. He had not taken his lisinopril this morning however it is low dose at 2.5 mg. 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  He is generally without complaints. 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 He denies any chest pain or worsening shortness of breath. He has been battling prostate cancer  and underwent external radiation and still has an elevated PSA. He's currently taking diuretics only as  Needed, especially when he eats salt. He was on both beta blocker and calcium channel blocker however the beta blocker was discontinued by myself due to symptomatic bradycardia. He had marked improvement in his symptoms when his heart rate has come up into the 60s. EKG today shows sinus rhythm with a PVC.  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 He is without complaints. He denies any chest pain, worsening shortness of breath, presyncope or syncopal symptoms. His last office visit we ordered an echocardiogram which shows a stable LVOT gradient assistant with hypertrophic cardiomyopathy. He has EKG changes which are stable. Blood pressure is well-controlled today. His only notable finding was that recently his PSA had been elevated. He is concerned about this and has a follow-up with Dr. Jeffie Mclean in the near future.  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 He is main concern is that he is bleeding very easily. He apparently caught his left elbow on a prior and this was covered with a bandage today. The gauze bandages completely soaked with blood although he had a very small puncture wound. I redressed this and the office and we held pressure on it. He says this is been a problem since been on aspirin and Plavix. He says he does get some pressure in his chest particularly after eating. He denies any chest pain, presyncope or syncopal symptoms with exertion. Of note we've been following  his echocardiogram which showed a stable LVOT gradient in the past. His last echo was 2016.  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 Over the past 6 months he's done very well. 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 He had an echo in November 2017 which did show an increased LVOT gradient 64 mmHg. Despite this he is not having  any new symptoms. Laboratory work is been stable. Cholesterol is at goal with last LDL 55 on atorvastatin 40 mg.  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  It has been about 8 months since I last saw him.  He underwent a recent echo that showed a rest LVOT gradient of 45 mmHg with LVEF 65-70% and severe asymmetric septal hypertrophy consistent with his known diagnosis of hypertrophic cardiomyopathy.  His outflow tract gradient at rest is higher than previously reported.  I asked him at length today whether he was having any new symptoms.  He says he may be a little bit more fatigue but overall he is doing really well.  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  He denies any presyncopal or syncopal events.  PMHx:  Past Medical History:  Diagnosis Date  . Acute CHF ([place]) 10/29/2012  . BPH (benign prostatic hyperplasia)   . Cardiomyopathy ([place])   . Coronary artery disease    Stents  . Family history of anesthesia complication    mother had problems"   . H/O cardiac catheterization    several with stents  . Heart murmur   . Hemorrhoids   . Hx of radiation therapy 04/29/12- 06/28/12   prostate 78 gray in 40 tx  . Hyperlipidemia   . IHSS (idiopathic hypertrophic subaortic stenosis) ([place]) 2006  . Obstructive uropathy   . Prostate cancer ([facility]) 02/19/12   Adenocarcinoa,3+4=7,(PSA=8.0 on 11/11/11  . PSA elevation 2011   5.4   . Shortness of breath     Past Surgical History:  Procedure Laterality Date  . BAND HEMORRHOIDECTOMY    . CARDIAC CATHETERIZATION  05/10/2004   CAD with crescendo angina, high-grade prox mid and mid-distal LAD with DES 3.5x34mm Cordis Cypher DES(Dr. Marella Mclean)   . CARDIAC CATHETERIZATION  03/25/2005   DES X3 (3x5x10mm, 3x56mm, 2.5x96mm) of LAD, single-vessel diease with dominant right and moderate sized Cfx (Dr. Marella Mclean)   . COLONOSCOPY    . HERNIA REPAIR Bilateral    inguinal  . NM  MYOCAR PERF WALL MOTION  07/03/2011   lexiscan myoview; perfusion defect in inferior myocardium (diaphragmatic attenuation), remaining myocardium with normal perfusion; post-stress EF 56%  . PROSTATE BIOPSY  02/19/2012   Procedure: PROSTATE BIOPSY;  Surgeon: [name], MD;  Location: [facility];  Service: Urology;  Laterality: N/A;  . TRANSTHORACIC ECHOCARDIOGRAM  08/26/2012   EF 55-65%, mod conc LVH, grade 1 diastolic dysfunction; ventricular septum mod increased; mild AV regurg; mod MR; tricuspid valve prolpase    FAMHx:  Family History  Problem Relation Age of Onset  . Colon cancer Mother 43  . Brain cancer Daughter 10       deceased, diabetes  . Heart attack Father   . Stroke Maternal Grandmother   . Colon cancer Sister   . Heart attack Brother 5  . Heart attack Brother        1st MI at 60  SOCHx:   reports that he quit smoking about 33 years ago. He quit smokeless tobacco use about 31 years ago. His smokeless tobacco use included chew. He reports that he drinks alcohol. He reports that he does not use drugs.  ALLERGIES:  Allergies  Allergen Reactions  . Compazine [Prochlorperazine] Hives    ROS: Pertinent items noted in HPI and remainder of comprehensive ROS otherwise negative.  HOME MEDS: Current Outpatient Medications  Medication Sig Dispense Refill  . aspirin 81 MG tablet Take 81 mg by mouth daily.    Marland Kitchen atorvastatin (LIPITOR) 40 MG tablet TAKE ONE TABLET BY MOUTH DAILY 90 tablet 2  . calcium carbonate (CALCIUM 600) 600 MG TABS tablet Take 600 mg by mouth daily with breakfast.    . furosemide (LASIX) 40 MG tablet Take 0.5 tablets (20 mg total) by mouth daily as needed. 30 tablet 7  . lisinopril (PRINIVIL,ZESTRIL) 2.5 MG tablet Take 1 tablet (2.5 mg total) by mouth daily. 30 tablet 5  . tamsulosin (FLOMAX) 0.4 MG CAPS capsule Take 1 capsule (0.4 mg total) by mouth daily. 30 capsule 5  . verapamil (VERELAN PM) 120 MG 24 hr capsule TAKE 1 CAPSULE (120 MG TOTAL) BY  MOUTH AT BEDTIME. 90 capsule 1   No current facility-administered medications for this visit.     LABS/IMAGING: No results found for this or any previous visit (from the past 48 hour(s)). No results found.  VITALS: BP 114/60   Pulse 62   Ht 5\' 6"  (1.676 m)   Wt 167 lb 12.8 oz (76.1 kg)   BMI 27.08 kg/m   EXAM: General appearance: alert and no distress Neck: no carotid bruit, no JVD, thyroid not enlarged, symmetric, no tenderness/mass/nodules and Bilateral hearing loss Lungs: clear to auscultation bilaterally Heart: regular rate and rhythm, S1, S2 normal and systolic murmur: decreased with valsalva 3/6, crescendo at 2nd right intercostal space Abdomen: soft, non-tender; bowel sounds normal; no masses,  no organomegaly Extremities: extremities normal, atraumatic, no cyanosis or edema Pulses: 2+ and symmetric Skin: Skin color, texture, turgor normal. No rashes or lesions Neurologic: Grossly normal Psych: Pleasant  EKG: Sinus rhythm with first-degree AV block at 62, ST and T wave abnormalities laterally-personally reviewed  ASSESSMENT: 1. Hypertrophic structure cardiomyopathy-45 mmHg rest gradient (2018) EF 65-70% 2. Coronary artery disease with DES x3 the LAD (2004) 3. Hypertension 4. Dyslipidemia 5. Chronic diastolic heart failure 6. History of prostate cancer 7. Symptomatic bradycardia on B-blocker - improved  PLAN: 1.   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  His LVOT gradient at rest was 45 mmHg.  It was not tested with Valsalva.  I perform Valsalva maneuver in the office today and his murmur did change in intensity.  He certainly has LVOT obstruction.  He is not had any alarm symptoms at this point.  The septum is not thick enough to recommend an ICD at this point.  There is no evidence for arrhythmias.  He said no chest pain symptoms.  I recommend continue to follow him clinically.  We did discuss treatment options including  myomectomy and possibly alcohol septal ablation at some point.  Pixie Casino, MD, [facility], [place] Director of the Advanced Lipid Disorders &  Card[facility] Attending Cardiologist  Direct Dial: [phone]  Fax: [phone]  Website:  [email or web address] 05/23/2017, 3:35 PM

## 2017-05-25 ENCOUNTER — Encounter: Payer: Self-pay | Admitting: Internal Medicine

## 2017-07-13 ENCOUNTER — Other Ambulatory Visit: Payer: Self-pay | Admitting: Internal Medicine

## 2017-07-13 NOTE — Telephone Encounter (Signed)
Rx request sent to pharmacy.  

## 2017-09-01 ENCOUNTER — Other Ambulatory Visit: Payer: Self-pay | Admitting: Internal Medicine

## 2017-09-01 NOTE — Telephone Encounter (Signed)
REFILL 

## 2017-10-10 ENCOUNTER — Other Ambulatory Visit: Payer: Self-pay | Admitting: Internal Medicine

## 2017-10-12 NOTE — Telephone Encounter (Signed)
REFILL 

## 2018-03-12 ENCOUNTER — Telehealth: Payer: Self-pay | Admitting: Internal Medicine

## 2018-03-12 NOTE — Telephone Encounter (Signed)
New Message          Patient's wife is calling today for a schedule for the patient, however; there is no availability until Dec. Patient is getting worse per his wife. Pls call and advise.

## 2018-03-12 NOTE — Telephone Encounter (Signed)
Spoke with pt wife. appt scheduled with Dr.Hilty for 10/9 @ 10am. She verbalized appreciation for the assistance

## 2018-03-17 ENCOUNTER — Ambulatory Visit: Payer: Medicare Other | Admitting: Internal Medicine

## 2018-03-17 ENCOUNTER — Encounter: Payer: Self-pay | Admitting: Internal Medicine

## 2018-03-17 VITALS — BP 119/73 | HR 61 | Ht 66.0 in | Wt 168.0 lb

## 2018-03-17 DIAGNOSIS — I2510 Atherosclerotic heart disease of native coronary artery without angina pectoris: Secondary | ICD-10-CM | POA: Diagnosis not present

## 2018-03-17 DIAGNOSIS — I5032 Chronic diastolic (congestive) heart failure: Secondary | ICD-10-CM

## 2018-03-17 DIAGNOSIS — I421 Obstructive hypertrophic cardiomyopathy: Secondary | ICD-10-CM | POA: Diagnosis not present

## 2018-03-17 DIAGNOSIS — R0789 Other chest pain: Secondary | ICD-10-CM | POA: Diagnosis not present

## 2018-03-17 NOTE — Progress Notes (Signed)
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. He has a history of hypertrophic obstructive cardiomyopathy, dyslipidemia and a history of prostate cancer in the past. He has a history of complex coronary artery disease. In 2005 he underwent cardiac catheterization and under ultimately had drug-eluting stenting x3 of the LAD and was noted to have a dominant right coronary moderate size circumflex artery. His last catheterization was in 2006 and since then has had lower stress test. He also has significant diastolic heart failure.  He was seen by myself in the emergency department last year for acute on chronic diastolic heart failure.  He is stage II diastolic dysfunction on his echo and responded to diuresis. Echo does have suggested that the areas CM and at least a 41 mm outflow tract gradient with valsalva.  This appears to be fairly stable. His weight on discharge from the hospital last year was 168 pounds and is stable today.  He denies any worsening shortness of breath and only reports a small amount of lower extremity swelling.  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 He reported some fatigue and lack of energy. He was noted to be bradycardic today with a long first degree AV block in the 40s. Blood pressure was 100/52. I recommended discontinuing his beta blocker. Since doing that he's had a marked improvement in his energy level. His blood pressure today was 120/82 when I rechecked it. He had not taken his lisinopril this morning however it is low dose at 2.5 mg. 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  He is generally without complaints. 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 He denies any chest pain or worsening shortness of breath. He has been battling  prostate cancer and underwent external radiation and still has an elevated PSA. He's currently taking diuretics only as  Needed, especially when he eats salt. He was on both beta blocker and calcium channel blocker however the beta blocker was discontinued by myself due to symptomatic bradycardia. He had marked improvement in his symptoms when his heart rate has come up into the 60s. EKG today shows sinus rhythm with a PVC.  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 He is without complaints. He denies any chest pain, worsening shortness of breath, presyncope or syncopal symptoms. His last office visit we ordered an echocardiogram which shows a stable LVOT gradient assistant with hypertrophic cardiomyopathy. He has EKG changes which are stable. Blood pressure is well-controlled today. His only notable finding was that recently his PSA had been elevated. He is concerned about this and has a follow-up with [name] in the near future.  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 He is main concern is that he is bleeding very easily. He apparently caught his left elbow on a prior and this was covered with a bandage today. The gauze bandages completely soaked with blood although he had a very small puncture wound. I redressed this and the office and we held pressure on it. He says this is been a problem since been on aspirin and Plavix. He says he does get some pressure in his chest particularly after eating. He denies any chest pain, presyncope or syncopal symptoms with exertion. Of note we've  been following his echocardiogram which showed a stable LVOT gradient in the past. His last echo was 2016.  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 Over the past 6 months he's done very well. 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 He had an echo in November 2017 which did show an increased LVOT gradient 64 mmHg. Despite this  he is not having any new symptoms. Laboratory work is been stable. Cholesterol is at goal with last LDL 55 on atorvastatin 40 mg.  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  It has been about 8 months since I last saw him.  He underwent a recent echo that showed a rest LVOT gradient of 45 mmHg with LVEF 65-70% and severe asymmetric septal hypertrophy consistent with his known diagnosis of hypertrophic cardiomyopathy.  His outflow tract gradient at rest is higher than previously reported.  I asked him at length today whether he was having any new symptoms.  He says he may be a little bit more fatigue but overall he is doing really well.  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  He denies any presyncopal or syncopal events.  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  He reports recently he has had some progressive discomfort in the chest with exertion.  He noted moving heavier objects or carrying things is starting to cause some tightness in his chest.  It seems to resolve with rest.  There is some mild associated shortness of breath.  He felt like this might be more consistent with progression of his hypertrophic cardiomyopathy.  As mentioned previously his last stress gradient was a 45 mmHg.  LVEF was 65 to 70%.  He does also have a history of coronary disease which is remote.  An EKG performed today shows sinus rhythm with first-degree AV block, LVH with repolarization.  Blood pressure is well controlled.  PMHx:  Past Medical History:  Diagnosis Date  . Acute CHF ([place]) 10/29/2012  . BPH (benign prostatic hyperplasia)   . Cardiomyopathy ([place])   . Coronary artery disease    Stents  . Family history of anesthesia complication    mother had problems"   . H/O cardiac catheterization    several with stents  . Heart murmur   . Hemorrhoids   . Hx of radiation therapy 04/29/12- 06/28/12   prostate 78 gray in 40 tx    . Hyperlipidemia   . IHSS (idiopathic hypertrophic subaortic stenosis) ([place]) 2006  . Obstructive uropathy   . Prostate cancer ([place]) 02/19/12   Adenocarc[place]a,3+4=7,(PSA=8.0 on 11/11/11  . PSA elevation 2011   5.4   . Shortness of breath     Past Surgical History:  Procedure Laterality Date  . BAND HEMORRHOIDECTOMY    . CARDIAC CATHETERIZATION  05/10/2004   CAD with crescendo angina, high-grade prox mid and mid-distal LAD with DES 3.5x46mm Cordis Cypher DES(Dr. [name])   . CARDIAC CATHETERIZATION  03/25/2005   DES X3 (3x5x54mm, 3x26mm, 2.5x25mm) of LAD, single-vessel diease with dominant right and moderate sized Cfx (Dr. [name])   . COLONOSCOPY    . HERNIA REPAIR Bilateral    inguinal  . NM MYOCAR PERF WALL MOTION  07/03/2011   lexiscan myoview; perfusion defect in inferior myocardium (diaphragmatic attenuation), remaining myocardium with normal perfusion; post-stress EF 56%  .  PROSTATE BIOPSY  02/19/2012   Procedure: PROSTATE BIOPSY;  Surgeon: [name], MD;  Location: [facility];  Service: Urology;  Laterality: N/A;  . TRANSTHORACIC ECHOCARDIOGRAM  08/26/2012   EF 55-65%, mod conc LVH, grade 1 diastolic dysfunction; ventricular septum mod increased; mild AV regurg; mod MR; tricuspid valve prolpase    FAMHx:  Family History  Problem Relation Age of Onset  . Colon cancer Mother 19  . Brain cancer Daughter 46       deceased, diabetes  . Heart attack Father   . Stroke Maternal Grandmother   . Colon cancer Sister   . Heart attack Brother 44  . Heart attack Brother        1st MI at 76    SOCHx:   reports that he quit smoking about 34 years ago. He quit smokeless tobacco use about 32 years ago.  His smokeless tobacco use included chew. He reports that he drinks alcohol. He reports that he does not use drugs.  ALLERGIES:  Allergies  Allergen Reactions  . Compazine [Prochlorperazine] Hives    ROS: Pertinent items noted in HPI and remainder of comprehensive ROS  otherwise negative.  HOME MEDS: Current Outpatient Medications  Medication Sig Dispense Refill  . aspirin 81 MG tablet Take 81 mg by mouth daily.    Marland Kitchen atorvastatin (LIPITOR) 40 MG tablet TAKE ONE TABLET BY MOUTH DAILY 90 tablet 2  . calcium carbonate (CALCIUM 600) 600 MG TABS tablet Take 600 mg by mouth daily with breakfast.    . furosemide (LASIX) 40 MG tablet Take 0.5 tablets (20 mg total) by mouth daily as needed. 30 tablet 7  . lisinopril (PRINIVIL,ZESTRIL) 2.5 MG tablet TAKE ONE TABLET BY MOUTH DAILY 90 tablet 3  . Multiple Vitamins-Minerals (VITAMIN D3 COMPLETE PO) Take 50 mcg by mouth daily.    . tamsulosin (FLOMAX) 0.4 MG CAPS capsule Take 1 capsule (0.4 mg total) by mouth daily. 30 capsule 5  . verapamil (VERELAN PM) 120 MG 24 hr capsule TAKE 1 CAPSULE (120 MG TOTAL) BY MOUTH AT BEDTIME. 90 capsule 3  . silodosin (RAPAFLO) 8 MG CAPS capsule      No current facility-administered medications for this visit.     LABS/IMAGING: No results found for this or any previous visit (from the past 48 hour(s)). No results found.  VITALS: BP 119/73   Pulse 61   Ht 5\' 6"  (1.676 m)   Wt 168 lb (76.2 kg)   BMI 27.12 kg/m   EXAM: General appearance: alert and no distress Neck: no carotid bruit, no JVD, thyroid not enlarged, symmetric, no tenderness/mass/nodules and Bilateral hearing loss Lungs: clear to auscultation bilaterally Heart: regular rate and rhythm, S1, S2 normal and systolic murmur: decreased with valsalva 3/6, crescendo at 2nd right intercostal space Abdomen: soft, non-tender; bowel sounds normal; no masses,  no organomegaly Extremities: extremities normal, atraumatic, no cyanosis or edema Pulses: 2+ and symmetric Skin: Skin color, texture, turgor normal. No rashes or lesions Neurologic: Grossly normal Psych: Pleasant  EKG: Sinus rhythm first-degree AV block 61, LVH with re-pole-personally reviewed  ASSESSMENT: 1. Exertional chest pressure 2. Hypertrophic structure  cardiomyopathy-45 mmHg rest gradient (2018) EF 65-70% 3. Coronary artery disease with DES x3 the LAD (2004) 4. Hypertension 5. Dyslipidemia 6. Chronic diastolic heart failure 7. History of prostate cancer 8. Symptomatic bradycardia on B-blocker - improved  PLAN: 1.   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  This could be from worsening hypertrophic cardiomyopathy or perhaps  worsening coronary artery disease or combination of the both.  EKG does not show any acute ischemic changes.  His blood pressure is well controlled and heart rate is low in the low 60s.  I recommend we repeat his echocardiogram.  Most likely he will need an ischemic evaluation.  I would suspect that heart catheterization would be most helpful.  If we can rule out any coronary artery disease then we will have to determine whether or not we need to proceed with any further structural work-up regarding his HOCM.  We will contact him with results of his echo and how to proceed further.  Pixie Casino, MD, [facility], [place] Director of the Advanced Lipid Disorders &  Cardiovascular Risk Reduction Clinic Attending Cardiologist  Direct Dial: [phone]  Fax: [phone]  Website:  [email or web address] Achee 03/17/2018, 10:09 AM

## 2018-03-17 NOTE — Patient Instructions (Signed)
Medication Instructions:  Continue current medications  If you need a refill on your cardiac medications before your next appointment, please call your pharmacy.   Lab work: NONE  Testing/Procedures: Your physician has requested that you have an echocardiogram. Echocardiography is a painless test that uses sound waves to create images of your heart. It provides your doctor with information about the size and shape of your heart and how well your heart's chambers and valves are working. This procedure takes approximately one hour. There are no restrictions for this procedure. -- done at [address] - [address] Floor  Follow-Up: At [facility], you and your health needs are our priority.  As part of our continuing mission to provide you with exceptional heart care, we have created designated Provider Care Teams.  These Care Teams include your primary Cardiologist (physician) and Advanced Practice Providers (APPs -  Physician Assistants and Nurse Practitioners) who all work together to provide you with the care you need, when you need it. You will need a follow up appointment after your echo.  You may see Dr. Debara Pickett or one of the following Advanced Practice Providers on your designated Care Team: Almyra Deforest, [place] . Fabian Sharp, PA-C

## 2018-03-24 ENCOUNTER — Other Ambulatory Visit: Payer: Self-pay

## 2018-03-24 ENCOUNTER — Ambulatory Visit (HOSPITAL_COMMUNITY): Payer: Medicare Other | Attending: Cardiology

## 2018-03-24 DIAGNOSIS — R0789 Other chest pain: Secondary | ICD-10-CM | POA: Diagnosis not present

## 2018-03-31 ENCOUNTER — Ambulatory Visit: Payer: Medicare Other | Admitting: Internal Medicine

## 2018-03-31 ENCOUNTER — Encounter: Payer: Self-pay | Admitting: Internal Medicine

## 2018-03-31 VITALS — BP 124/72 | HR 71 | Ht 66.0 in | Wt 172.0 lb

## 2018-03-31 DIAGNOSIS — I2510 Atherosclerotic heart disease of native coronary artery without angina pectoris: Secondary | ICD-10-CM

## 2018-03-31 DIAGNOSIS — I421 Obstructive hypertrophic cardiomyopathy: Secondary | ICD-10-CM

## 2018-03-31 DIAGNOSIS — Z01812 Encounter for preprocedural laboratory examination: Secondary | ICD-10-CM

## 2018-03-31 DIAGNOSIS — R079 Chest pain, unspecified: Secondary | ICD-10-CM

## 2018-03-31 DIAGNOSIS — R0602 Shortness of breath: Secondary | ICD-10-CM

## 2018-03-31 NOTE — Patient Instructions (Signed)
Medication Instructions:  Continue current medications If you need a refill on your cardiac medications before your next appointment, please call your pharmacy.   Lab work: BMET/CBC done - pre-cath lab work If you have labs (blood work) drawn today and your tests are completely normal, you will receive your results only by: Marland Kitchen MyChart Message (if you have MyChart) OR . A paper copy in the mail If you have any lab test that is abnormal or we need to change your treatment, we will call you to review the results.  Testing/Procedures: Your physician has requested that you have a cardiac catheterization. Cardiac catheterization is used to diagnose and/or treat various heart conditions. Doctors may recommend this procedure for a number of different reasons. The most common reason is to evaluate chest pain. Chest pain can be a symptom of coronary artery disease (CAD), and cardiac catheterization can show whether plaque is narrowing or blocking your heart's arteries. This is done at [facility]. Instructions are noted below.     Follow-Up: At [facility], you and your health needs are our priority.  As part of our continuing mission to provide you with exceptional heart care, we have created designated Provider Care Teams.  These Care Teams include your primary Cardiologist (physician) and Advanced Practice Providers (APPs -  Physician Assistants and Nurse Practitioners) who all work together to provide you with the care you need, when you need it. You will need a follow up appointment in 3-4 weeks after cardiac catheterization. You may see Dr. Debara Pickett or one of the following Advanced Practice Providers on your designated Care Team: Almyra Deforest, [place] . Fabian Sharp, PA-C  Any Other Special Instructions Will Be Listed Below (If Applicable).  Cardiac Catheterization Instructions:   [address]  [place] [address] Dept: [phone] Loc: [phone]  JAMALL STROHMEIER  03/31/2018  You are scheduled for a Cardiac Catheterization on Wednesday, November 20 with Dr. Shelva Majestic.  1. Please arrive at the [facility] (Main Entrance A) at [facility]: [address] [place], [place] [address] at 6:30 AM (This time is two hours before your procedure to ensure your preparation). Free valet parking service is available.   Special note: Every effort is made to have your procedure done on time. Please understand that emergencies sometimes delay scheduled procedures.  2. Diet: Do not eat solid foods after midnight the evening prior to procedure. You may have clear liquids until 5am upon the day of the procedure.  3. Labs: You will need to have blood drawn about 1 week prior to your heart cath procedure  4. Medication instructions in preparation for your procedure:  On the morning of your procedure, take your Aspirin and any morning medicines.  You may use sips of water.  5. Plan for one night stay--bring personal belongings. 6. Bring a current list of your medications and current insurance cards. 7. You MUST have a responsible person to drive you home. 8. Someone MUST be with you the first 24 hours after you arrive home or your discharge will be delayed. 9. Please wear clothes that are easy to get on and off and wear slip-on shoes.  Thank you for allowing [place] to care for you!   -- [place] [facility]

## 2018-03-31 NOTE — Progress Notes (Addendum)
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. He has a history of hypertrophic obstructive cardiomyopathy, dyslipidemia and a history of prostate cancer in the past. He has a history of complex coronary artery disease. In 2005 he underwent cardiac catheterization and under ultimately had drug-eluting stenting x3 of the LAD and was noted to have a dominant right coronary moderate size circumflex artery. His last catheterization was in 2006 and since then has had lower stress test. He also has significant diastolic heart failure.  He was seen by myself in the emergency department last year for acute on chronic diastolic heart failure.  He is stage II diastolic dysfunction on his echo and responded to diuresis. Echo does have suggested that the areas CM and at least a 41 mm outflow tract gradient with valsalva.  This appears to be fairly stable. His weight on discharge from the hospital last year was 168 pounds and is stable today.  He denies any worsening shortness of breath and only reports a small amount of lower extremity swelling.  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 He reported some fatigue and lack of energy. He was noted to be bradycardic today with a long first degree AV block in the 40s. Blood pressure was 100/52. I recommended discontinuing his beta blocker. Since doing that he's had a marked improvement in his energy level. His blood pressure today was 120/82 when I rechecked it. He had not taken his lisinopril this morning however it is low dose at 2.5 mg. 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  He is generally without complaints. 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 He denies any chest pain or worsening shortness of breath. He has been battling prostate  cancer and underwent external radiation and still has an elevated PSA. He's currently taking diuretics only as  Needed, especially when he eats salt. He was on both beta blocker and calcium channel blocker however the beta blocker was discontinued by myself due to symptomatic bradycardia. He had marked improvement in his symptoms when his heart rate has come up into the 60s. EKG today shows sinus rhythm with a PVC.  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 He is without complaints. He denies any chest pain, worsening shortness of breath, presyncope or syncopal symptoms. His last office visit we ordered an echocardiogram which shows a stable LVOT gradient assistant with hypertrophic cardiomyopathy. He has EKG changes which are stable. Blood pressure is well-controlled today. His only notable finding was that recently his PSA had been elevated. He is concerned about this and has a follow-up with [name] in the near future.  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 He is main concern is that he is bleeding very easily. He apparently caught his left elbow on a prior and this was covered with a bandage today. The gauze bandages completely soaked with blood although he had a very small puncture wound. I redressed this and the office and we held pressure on it. He says this is been a problem since been on aspirin and Plavix. He says he does get some pressure in his chest particularly after eating. He denies any chest pain, presyncope or syncopal symptoms with exertion. Of note we've been following  his echocardiogram which showed a stable LVOT gradient in the past. His last echo was 2016.  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 Over the past 6 months he's done very well. 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 He had an echo in November 2017 which did show an increased LVOT gradient 64 mmHg. Despite this he is not  having any new symptoms. Laboratory work is been stable. Cholesterol is at goal with last LDL 55 on atorvastatin 40 mg.  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  It has been about 8 months since I last saw him.  He underwent a recent echo that showed a rest LVOT gradient of 45 mmHg with LVEF 65-70% and severe asymmetric septal hypertrophy consistent with his known diagnosis of hypertrophic cardiomyopathy.  His outflow tract gradient at rest is higher than previously reported.  I asked him at length today whether he was having any new symptoms.  He says he may be a little bit more fatigue but overall he is doing really well.  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  He denies any presyncopal or syncopal events.  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  He reports recently he has had some progressive discomfort in the chest with exertion.  He noted moving heavier objects or carrying things is starting to cause some tightness in his chest.  It seems to resolve with rest.  There is some mild associated shortness of breath.  He felt like this might be more consistent with progression of his hypertrophic cardiomyopathy.  As mentioned previously his last stress gradient was a 45 mmHg.  LVEF was 65 to 70%.  He does also have a history of coronary disease which is remote.  An EKG performed today shows sinus rhythm with first-degree AV block, LVH with repolarization.  Blood pressure is well controlled.  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  He underwent an echocardiogram to further evaluate hypertrophic cardiomyopathy and his progressive shortness of breath and chest discomfort.  The echo does show significant increase in LVOT gradient with a peak gradient of 66 mmHg at rest that increased up to 71 mmHg with Valsalva.  LVEF was vigorous at 65 to 70% with no regional wall motion abnormalities and grade 2  diastolic dysfunction.  PMHx:  Past Medical History:  Diagnosis Date  . Acute CHF ([place]) 10/29/2012  . BPH (benign prostatic hyperplasia)   . Cardiomyopathy ([place])   . Coronary artery disease    Stents  . Family history of anesthesia complication    mother had problems"   . H/O cardiac catheterization    several with stents  . Heart murmur   . Hemorrhoids   . Hx of radiation therapy 04/29/12- 06/28/12   prostate 78 gray in 40 tx  . Hyperlipidemia   . IHSS (idiopathic hypertrophic subaortic stenosis) ([place]) 2006  . Obstructive uropathy   . Prostate cancer ([place]) 02/19/12   Adenocarcinoa,3+4=7,(PSA=8.0 on 11/11/11  . PSA elevation 2011   5.4   . Shortness of breath     Past Surgical History:  Procedure Laterality Date  . BAND HEMORRHOIDECTOMY    . CARDIAC CATHETERIZATION  05/10/2004   CAD with crescendo angina, high-grade prox mid and mid-distal LAD with DES 3.5x58mm Cordis Cypher DES(Dr. [name])   .  CARDIAC CATHETERIZATION  03/25/2005   DES X3 (3x5x38mm, 3x54mm, 2.5x57mm) of LAD, single-vessel diease with dominant right and moderate sized Cfx (Dr. [name])   . COLONOSCOPY    . HERNIA REPAIR Bilateral    inguinal  . NM MYOCAR PERF WALL MOTION  07/03/2011   lexiscan myoview; perfusion defect in inferior myocardium (diaphragmatic attenuation), remaining myocardium with normal perfusion; post-stress EF 56%  . PROSTATE BIOPSY  02/19/2012   Procedure: PROSTATE BIOPSY;  Surgeon: [name], MD;  Location: [facility];  Service: Urology;  Laterality: N/A;  . TRANSTHORACIC ECHOCARDIOGRAM  08/26/2012   EF 55-65%, mod conc LVH, grade 1 diastolic dysfunction; ventricular septum mod increased; mild AV regurg; mod MR; tricuspid valve prolpase    FAMHx:  Family History  Problem Relation Age of Onset  . Colon cancer Mother 76  . Brain cancer Daughter 43       deceased, diabetes  . Heart attack Father   . Stroke Maternal Grandmother   . Colon cancer Sister   . Heart attack Brother  59  . Heart attack Brother        1st MI at 15    SOCHx:   reports that he quit smoking about 34 years ago. He quit smokeless tobacco use about 32 years ago.  His smokeless tobacco use included chew. He reports that he drinks alcohol. He reports that he does not use drugs.  ALLERGIES:  Allergies  Allergen Reactions  . Compazine [Prochlorperazine] Hives    ROS: Pertinent items noted in HPI and remainder of comprehensive ROS otherwise negative.  HOME MEDS: Current Outpatient Medications  Medication Sig Dispense Refill  . aspirin 81 MG tablet Take 81 mg by mouth daily.    Marland Kitchen atorvastatin (LIPITOR) 40 MG tablet TAKE ONE TABLET BY MOUTH DAILY 90 tablet 2  . calcium carbonate (CALCIUM 600) 600 MG TABS tablet Take 600 mg by mouth daily with breakfast.    . furosemide (LASIX) 40 MG tablet Take 0.5 tablets (20 mg total) by mouth daily as needed. 30 tablet 7  . lisinopril (PRINIVIL,ZESTRIL) 2.5 MG tablet TAKE ONE TABLET BY MOUTH DAILY 90 tablet 3  . Multiple Vitamins-Minerals (VITAMIN D3 COMPLETE PO) Take 50 mcg by mouth daily.    . Omega-3 Fatty Acids (FISH OIL PO) Take by mouth.    . silodosin (RAPAFLO) 8 MG CAPS capsule     . tamsulosin (FLOMAX) 0.4 MG CAPS capsule Take 1 capsule (0.4 mg total) by mouth daily. 30 capsule 5  . verapamil (VERELAN PM) 120 MG 24 hr capsule TAKE 1 CAPSULE (120 MG TOTAL) BY MOUTH AT BEDTIME. 90 capsule 3   No current facility-administered medications for this visit.     LABS/IMAGING: No results found for this or any previous visit (from the past 48 hour(s)). No results found.  VITALS: BP 124/72   Pulse 71   Ht 5\' 6"  (1.676 m)   Wt 172 lb (78 kg)   SpO2 96%   BMI 27.76 kg/m   EXAM: Deferred  EKG: Deferred  ASSESSMENT: 1. Exertional chest pressure 2. Hypertrophic structure cardiomyopathy-45 mmHg rest gradient (2018) EF 65-70% 3. Coronary artery disease with DES x3 the LAD (2004) 4. Hypertension 5. Dyslipidemia 6. Chronic diastolic heart  failure 7. History of prostate cancer 8. Symptomatic bradycardia on B-blocker - improved  PLAN: 1.   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  This increases further up to 71  mmHg with Valsalva and is highly suggestive of hypertrophic cardiomyopathy.  He has no regional wall motion abnormalities however did have a history of coronary disease with stents to the LAD remotely.  I like for him to undergo repeat cardiac catheterization including left and right heart catheterization and coronary angiography to rule out any obstructive coronary disease which may be causing his symptoms.  This would also serve as a preoperative evaluation and then I would likely refer him to Dr. Derinda Sis at [facility] for evaluation of possible treatments for hypertrophic cardiomyopathy.  I discussed the risk, benefits and alternatives to cardiac catheterization with him in great detail as well as explained the procedure.  He seems to understand and comprehensive those risks and provided informed consent for the procedure.  Pixie Casino, MD, [facility], [place] Director of the Advanced Lipid Disorders &  Cardiovascular Risk Reduction Clinic Attending Cardiologist  Direct Dial: [phone]  Fax: [phone]  Website:  [email or web address]  Nadean Corwin Hilty 03/31/2018, 11:00 AM

## 2018-03-31 NOTE — H&P (View-Only) (Signed)
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. He has a history of hypertrophic obstructive cardiomyopathy, dyslipidemia and a history of prostate cancer in the past. He has a history of complex coronary artery disease. In 2005 he underwent cardiac catheterization and under ultimately had drug-eluting stenting x3 of the LAD and was noted to have a dominant right coronary moderate size circumflex artery. His last catheterization was in 2006 and since then has had lower stress test. He also has significant diastolic heart failure.  He was seen by myself in the emergency department last year for acute on chronic diastolic heart failure.  He is stage II diastolic dysfunction on his echo and responded to diuresis. Echo does have suggested that the areas CM and at least a 41 mm outflow tract gradient with valsalva.  This appears to be fairly stable. His weight on discharge from the hospital last year was 168 pounds and is stable today.  He denies any worsening shortness of breath and only reports a small amount of lower extremity swelling.  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 He reported some fatigue and lack of energy. He was noted to be bradycardic today with a long first degree AV block in the 40s. Blood pressure was 100/52. I recommended discontinuing his beta blocker. Since doing that he's had a marked improvement in his energy level. His blood pressure today was 120/82 when I rechecked it. He had not taken his lisinopril this morning however it is low dose at 2.5 mg. 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  He is generally without complaints. 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 He denies any chest pain or worsening shortness of breath. He has been battling prostate  cancer and underwent external radiation and still has an elevated PSA. He's currently taking diuretics only as  Needed, especially when he eats salt. He was on both beta blocker and calcium channel blocker however the beta blocker was discontinued by myself due to symptomatic bradycardia. He had marked improvement in his symptoms when his heart rate has come up into the 60s. EKG today shows sinus rhythm with a PVC.  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 He is without complaints. He denies any chest pain, worsening shortness of breath, presyncope or syncopal symptoms. His last office visit we ordered an echocardiogram which shows a stable LVOT gradient assistant with hypertrophic cardiomyopathy. He has EKG changes which are stable. Blood pressure is well-controlled today. His only notable finding was that recently his PSA had been elevated. He is concerned about this and has a follow-up with [name] in the near future.  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 He is main concern is that he is bleeding very easily. He apparently caught his left elbow on a prior and this was covered with a bandage today. The gauze bandages completely soaked with blood although he had a very small puncture wound. I redressed this and the office and we held pressure on it. He says this is been a problem since been on aspirin and Plavix. He says he does get some pressure in his chest particularly after eating. He denies any chest pain, presyncope or syncopal symptoms with exertion. Of note we've been following  his echocardiogram which showed a stable LVOT gradient in the past. His last echo was 2016.  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 Over the past 6 months he's done very well. 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 He had an echo in November 2017 which did show an increased LVOT gradient 64 mmHg. Despite this he is not  having any new symptoms. Laboratory work is been stable. Cholesterol is at goal with last LDL 55 on atorvastatin 40 mg.  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  It has been about 8 months since I last saw him.  He underwent a recent echo that showed a rest LVOT gradient of 45 mmHg with LVEF 65-70% and severe asymmetric septal hypertrophy consistent with his known diagnosis of hypertrophic cardiomyopathy.  His outflow tract gradient at rest is higher than previously reported.  I asked him at length today whether he was having any new symptoms.  He says he may be a little bit more fatigue but overall he is doing really well.  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  He denies any presyncopal or syncopal events.  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  He reports recently he has had some progressive discomfort in the chest with exertion.  He noted moving heavier objects or carrying things is starting to cause some tightness in his chest.  It seems to resolve with rest.  There is some mild associated shortness of breath.  He felt like this might be more consistent with progression of his hypertrophic cardiomyopathy.  As mentioned previously his last stress gradient was a 45 mmHg.  LVEF was 65 to 70%.  He does also have a history of coronary disease which is remote.  An EKG performed today shows sinus rhythm with first-degree AV block, LVH with repolarization.  Blood pressure is well controlled.  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  He underwent an echocardiogram to further evaluate hypertrophic cardiomyopathy and his progressive shortness of breath and chest discomfort.  The echo does show significant increase in LVOT gradient with a peak gradient of 66 mmHg at rest that increased up to 71 mmHg with Valsalva.  LVEF was vigorous at 65 to 70% with no regional wall motion abnormalities and grade 2  diastolic dysfunction.  PMHx:  Past Medical History:  Diagnosis Date  . Acute CHF ([place]) 10/29/2012  . BPH (benign prostatic hyperplasia)   . Cardiomyopathy ([place])   . Coronary artery disease    Stents  . Family history of anesthesia complication    mother had problems"   . H/O cardiac catheterization    several with stents  . Heart murmur   . Hemorrhoids   . Hx of radiation therapy 04/29/12- 06/28/12   prostate 78 gray in 40 tx  . Hyperlipidemia   . IHSS (idiopathic hypertrophic subaortic stenosis) ([place]) 2006  . Obstructive uropathy   . Prostate cancer ([place]) 02/19/12   Adenocarcinoa,3+4=7,(PSA=8.0 on 11/11/11  . PSA elevation 2011   5.4   . Shortness of breath     Past Surgical History:  Procedure Laterality Date  . BAND HEMORRHOIDECTOMY    . CARDIAC CATHETERIZATION  05/10/2004   CAD with crescendo angina, high-grade prox mid and mid-distal LAD with DES 3.5x63mm Cordis Cypher DES(Dr. [name])   .  CARDIAC CATHETERIZATION  03/25/2005   DES X3 (3x5x97mm, 3x26mm, 2.5x57mm) of LAD, single-vessel diease with dominant right and moderate sized Cfx (Dr. [name])   . COLONOSCOPY    . HERNIA REPAIR Bilateral    inguinal  . NM MYOCAR PERF WALL MOTION  07/03/2011   lexiscan myoview; perfusion defect in inferior myocardium (diaphragmatic attenuation), remaining myocardium with normal perfusion; post-stress EF 56%  . PROSTATE BIOPSY  02/19/2012   Procedure: PROSTATE BIOPSY;  Surgeon: [name], MD;  Location: [facility];  Service: Urology;  Laterality: N/A;  . TRANSTHORACIC ECHOCARDIOGRAM  08/26/2012   EF 55-65%, mod conc LVH, grade 1 diastolic dysfunction; ventricular septum mod increased; mild AV regurg; mod MR; tricuspid valve prolpase    FAMHx:  Family History  Problem Relation Age of Onset  . Colon cancer Mother 62  . Brain cancer Daughter 41       deceased, diabetes  . Heart attack Father   . Stroke Maternal Grandmother   . Colon cancer Sister   . Heart attack Brother  47  . Heart attack Brother        1st MI at 40    SOCHx:   reports that he quit smoking about 34 years ago. He quit smokeless tobacco use about 32 years ago.  His smokeless tobacco use included chew. He reports that he drinks alcohol. He reports that he does not use drugs.  ALLERGIES:  Allergies  Allergen Reactions  . Compazine [Prochlorperazine] Hives    ROS: Pertinent items noted in HPI and remainder of comprehensive ROS otherwise negative.  HOME MEDS: Current Outpatient Medications  Medication Sig Dispense Refill  . aspirin 81 MG tablet Take 81 mg by mouth daily.    Marland Kitchen atorvastatin (LIPITOR) 40 MG tablet TAKE ONE TABLET BY MOUTH DAILY 90 tablet 2  . calcium carbonate (CALCIUM 600) 600 MG TABS tablet Take 600 mg by mouth daily with breakfast.    . furosemide (LASIX) 40 MG tablet Take 0.5 tablets (20 mg total) by mouth daily as needed. 30 tablet 7  . lisinopril (PRINIVIL,ZESTRIL) 2.5 MG tablet TAKE ONE TABLET BY MOUTH DAILY 90 tablet 3  . Multiple Vitamins-Minerals (VITAMIN D3 COMPLETE PO) Take 50 mcg by mouth daily.    . Omega-3 Fatty Acids (FISH OIL PO) Take by mouth.    . silodosin (RAPAFLO) 8 MG CAPS capsule     . tamsulosin (FLOMAX) 0.4 MG CAPS capsule Take 1 capsule (0.4 mg total) by mouth daily. 30 capsule 5  . verapamil (VERELAN PM) 120 MG 24 hr capsule TAKE 1 CAPSULE (120 MG TOTAL) BY MOUTH AT BEDTIME. 90 capsule 3   No current facility-administered medications for this visit.     LABS/IMAGING: No results found for this or any previous visit (from the past 48 hour(s)). No results found.  VITALS: BP 124/72   Pulse 71   Ht 5\' 6"  (1.676 m)   Wt 172 lb (78 kg)   SpO2 96%   BMI 27.76 kg/m   EXAM: Deferred  EKG: Deferred  ASSESSMENT: 1. Exertional chest pressure 2. Hypertrophic structure cardiomyopathy-45 mmHg rest gradient (2018) EF 65-70% 3. Coronary artery disease with DES x3 the LAD (2004) 4. Hypertension 5. Dyslipidemia 6. Chronic diastolic heart  failure 7. History of prostate cancer 8. Symptomatic bradycardia on B-blocker - improved  PLAN: 1.   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  This increases further up to 71  mmHg with Valsalva and is highly suggestive of hypertrophic cardiomyopathy.  He has no regional wall motion abnormalities however did have a history of coronary disease with stents to the LAD remotely.  I like for him to undergo repeat cardiac catheterization including left and right heart catheterization and coronary angiography to rule out any obstructive coronary disease which may be causing his symptoms.  This would also serve as a preoperative evaluation and then I would likely refer him to Dr. Derinda Sis at [facility] for evaluation of possible treatments for hypertrophic cardiomyopathy.  I discussed the risk, benefits and alternatives to cardiac catheterization with him in great detail as well as explained the procedure.  He seems to understand and comprehensive those risks and provided informed consent for the procedure.  Pixie Casino, MD, [facility], [place] Director of the Advanced Lipid Disorders &  Cardiovascular Risk Reduction Clinic Attending Cardiologist  Direct Dial: [phone]  Fax: [phone]  Website:  [email or web address]  Nadean Corwin Logan Mclean 03/31/2018, 11:00 AM

## 2018-04-02 ENCOUNTER — Encounter: Payer: Self-pay | Admitting: Internal Medicine

## 2018-04-23 ENCOUNTER — Other Ambulatory Visit: Payer: Self-pay | Admitting: *Deleted

## 2018-04-23 DIAGNOSIS — R0602 Shortness of breath: Secondary | ICD-10-CM

## 2018-04-23 DIAGNOSIS — I421 Obstructive hypertrophic cardiomyopathy: Secondary | ICD-10-CM

## 2018-04-23 DIAGNOSIS — R079 Chest pain, unspecified: Secondary | ICD-10-CM

## 2018-04-23 DIAGNOSIS — I2510 Atherosclerotic heart disease of native coronary artery without angina pectoris: Secondary | ICD-10-CM

## 2018-04-23 LAB — BASIC METABOLIC PANEL
BUN/Creatinine Ratio: 15 (ref 10–24)
BUN: 16 mg/dL (ref 8–27)
CALCIUM: 8.6 mg/dL (ref 8.6–10.2)
CHLORIDE: 99 mmol/L (ref 96–106)
CO2: 22 mmol/L (ref 20–29)
Creatinine, Ser: 1.04 mg/dL (ref 0.76–1.27)
GFR calc Af Amer: 80 mL/min/{1.73_m2} (ref >59)
GFR calc non Af Amer: 69 mL/min/{1.73_m2} (ref >59)
GLUCOSE: 84 mg/dL (ref 65–99)
POTASSIUM: 5 mmol/L (ref 3.5–5.2)
SODIUM: 136 mmol/L (ref 134–144)

## 2018-04-23 LAB — CBC
Hematocrit: 35.5 % — ABNORMAL LOW (ref 37.5–51.0)
Hemoglobin: 13 g/dL (ref 13.0–17.7)
MCH: 32.3 pg (ref 26.6–33.0)
MCHC: 36.6 g/dL — AB (ref 31.5–35.7)
MCV: 88 fL (ref 79–97)
Platelets: 201 10*3/uL (ref 150–450)
RBC: 4.02 x10E6/uL — ABNORMAL LOW (ref 4.14–5.80)
RDW: 11.6 % — AB (ref 12.3–15.4)
WBC: 6.9 10*3/uL (ref 3.4–10.8)

## 2018-04-27 ENCOUNTER — Telehealth: Payer: Self-pay | Admitting: *Deleted

## 2018-04-27 NOTE — Telephone Encounter (Signed)
Pt contacted pre-catheterization scheduled at [facility] for: Wednesday November 20,2019 8:30 AM Verified arrival time and place: [place] Entrance A at: 6:30 AM  No solid food after midnight prior to cath, clear liquids until 5 AM day of procedure. Contrast allergy: no Verified no diabetes medications.  Hold: Furosemide-AM of procedure.  Except hold medications AM meds can be  taken pre-cath with sip of water including: ASA 81 mg  Confirmed patient has responsible person to drive home post procedure and for 24 hours after you arrive home: yes

## 2018-04-28 ENCOUNTER — Other Ambulatory Visit: Payer: Self-pay

## 2018-04-28 ENCOUNTER — Ambulatory Visit (HOSPITAL_COMMUNITY)
Admission: RE | Admit: 2018-04-28 | Discharge: 2018-04-28 | Disposition: A | Discharge status: Home / Self Care | Payer: Medicare Other | Source: Ambulatory Visit | Attending: Cardiovascular Disease | Admitting: Cardiovascular Disease

## 2018-04-28 ENCOUNTER — Encounter (HOSPITAL_COMMUNITY)
Admission: RE | Disposition: A | Discharge status: Home / Self Care | Payer: Self-pay | Source: Ambulatory Visit | Attending: Cardiovascular Disease

## 2018-04-28 DIAGNOSIS — E785 Hyperlipidemia, unspecified: Secondary | ICD-10-CM | POA: Diagnosis not present

## 2018-04-28 DIAGNOSIS — I5032 Chronic diastolic (congestive) heart failure: Secondary | ICD-10-CM | POA: Insufficient documentation

## 2018-04-28 DIAGNOSIS — Z955 Presence of coronary angioplasty implant and graft: Secondary | ICD-10-CM | POA: Diagnosis not present

## 2018-04-28 DIAGNOSIS — I25119 Atherosclerotic heart disease of native coronary artery with unspecified angina pectoris: Secondary | ICD-10-CM | POA: Insufficient documentation

## 2018-04-28 DIAGNOSIS — Z808 Family history of malignant neoplasm of other organs or systems: Secondary | ICD-10-CM | POA: Diagnosis not present

## 2018-04-28 DIAGNOSIS — R0602 Shortness of breath: Secondary | ICD-10-CM

## 2018-04-28 DIAGNOSIS — R9431 Abnormal electrocardiogram [ECG] [EKG]: Secondary | ICD-10-CM | POA: Insufficient documentation

## 2018-04-28 DIAGNOSIS — N400 Enlarged prostate without lower urinary tract symptoms: Secondary | ICD-10-CM | POA: Insufficient documentation

## 2018-04-28 DIAGNOSIS — R0609 Other forms of dyspnea: Secondary | ICD-10-CM | POA: Insufficient documentation

## 2018-04-28 DIAGNOSIS — Z800 Family history of malignant neoplasm of digestive organs: Secondary | ICD-10-CM | POA: Insufficient documentation

## 2018-04-28 DIAGNOSIS — Z7902 Long term (current) use of antithrombotics/antiplatelets: Secondary | ICD-10-CM | POA: Diagnosis not present

## 2018-04-28 DIAGNOSIS — I110 Hypertensive heart disease with heart failure: Secondary | ICD-10-CM | POA: Insufficient documentation

## 2018-04-28 DIAGNOSIS — R001 Bradycardia, unspecified: Secondary | ICD-10-CM | POA: Insufficient documentation

## 2018-04-28 DIAGNOSIS — Z9889 Other specified postprocedural states: Secondary | ICD-10-CM | POA: Insufficient documentation

## 2018-04-28 DIAGNOSIS — Z8249 Family history of ischemic heart disease and other diseases of the circulatory system: Secondary | ICD-10-CM | POA: Insufficient documentation

## 2018-04-28 DIAGNOSIS — I421 Obstructive hypertrophic cardiomyopathy: Secondary | ICD-10-CM | POA: Diagnosis not present

## 2018-04-28 DIAGNOSIS — Z823 Family history of stroke: Secondary | ICD-10-CM | POA: Diagnosis not present

## 2018-04-28 DIAGNOSIS — Z888 Allergy status to other drugs, medicaments and biological substances status: Secondary | ICD-10-CM | POA: Insufficient documentation

## 2018-04-28 DIAGNOSIS — Z87891 Personal history of nicotine dependence: Secondary | ICD-10-CM | POA: Insufficient documentation

## 2018-04-28 DIAGNOSIS — R079 Chest pain, unspecified: Secondary | ICD-10-CM

## 2018-04-28 DIAGNOSIS — I2510 Atherosclerotic heart disease of native coronary artery without angina pectoris: Secondary | ICD-10-CM | POA: Diagnosis not present

## 2018-04-28 DIAGNOSIS — Z7982 Long term (current) use of aspirin: Secondary | ICD-10-CM | POA: Diagnosis not present

## 2018-04-28 DIAGNOSIS — I429 Cardiomyopathy, unspecified: Secondary | ICD-10-CM | POA: Diagnosis not present

## 2018-04-28 DIAGNOSIS — Z8546 Personal history of malignant neoplasm of prostate: Secondary | ICD-10-CM | POA: Insufficient documentation

## 2018-04-28 DIAGNOSIS — Z79899 Other long term (current) drug therapy: Secondary | ICD-10-CM | POA: Diagnosis not present

## 2018-04-28 DIAGNOSIS — R011 Cardiac murmur, unspecified: Secondary | ICD-10-CM | POA: Diagnosis not present

## 2018-04-28 HISTORY — PX: RIGHT/LEFT HEART CATH AND CORONARY ANGIOGRAPHY: CATH118266

## 2018-04-28 LAB — POCT I-STAT 3, VENOUS BLOOD GAS (G3P V)
ACID-BASE DEFICIT: 1 mmol/L (ref 0.0–2.0)
Bicarbonate: 25.3 mmol/L (ref 20.0–28.0)
Bicarbonate: 25.9 mmol/L (ref 20.0–28.0)
O2 SAT: 64 %
O2 Saturation: 63 %
PH VEN: 7.344 (ref 7.250–7.430)
PO2 VEN: 36 mmHg (ref 32.0–45.0)
TCO2: 27 mmol/L (ref 22–32)
TCO2: 27 mmol/L (ref 22–32)
pCO2, Ven: 46.9 mmHg (ref 44.0–60.0)
pCO2, Ven: 47.5 mmHg (ref 44.0–60.0)
pH, Ven: 7.341 (ref 7.250–7.430)
pO2, Ven: 35 mmHg (ref 32.0–45.0)

## 2018-04-28 LAB — POCT I-STAT 3, ART BLOOD GAS (G3+)
ACID-BASE DEFICIT: 2 mmol/L (ref 0.0–2.0)
BICARBONATE: 23 mmol/L (ref 20.0–28.0)
O2 SAT: 94 %
TCO2: 24 mmol/L (ref 22–32)
pCO2 arterial: 37.7 mmHg (ref 32.0–48.0)
pH, Arterial: 7.392 (ref 7.350–7.450)
pO2, Arterial: 70 mmHg — ABNORMAL LOW (ref 83.0–108.0)

## 2018-04-28 SURGERY — RIGHT/LEFT HEART CATH AND CORONARY ANGIOGRAPHY
Anesthesia: LOCAL

## 2018-04-28 MED ORDER — FENTANYL CITRATE (PF) 100 MCG/2ML IJ SOLN
INTRAMUSCULAR | Status: DC | PRN
  Administered 2018-04-28: 25 ug via INTRAVENOUS

## 2018-04-28 MED ORDER — MIDAZOLAM HCL 2 MG/2ML IJ SOLN
INTRAMUSCULAR | Status: AC
  Filled 2018-04-28: qty 2

## 2018-04-28 MED ORDER — IOHEXOL 350 MG/ML SOLN
INTRAVENOUS | Status: DC | PRN
  Administered 2018-04-28: 50 mL via INTRA_ARTERIAL

## 2018-04-28 MED ORDER — SODIUM CHLORIDE 0.9 % IV SOLN: INTRAVENOUS | Status: DC

## 2018-04-28 MED ORDER — SODIUM CHLORIDE 0.9% FLUSH: 3.0000 mL | INTRAVENOUS | Status: DC | PRN

## 2018-04-28 MED ORDER — FENTANYL CITRATE (PF) 100 MCG/2ML IJ SOLN
INTRAMUSCULAR | Status: AC
  Filled 2018-04-28: qty 2

## 2018-04-28 MED ORDER — ASPIRIN 81 MG PO CHEW: 81.0000 mg | CHEWABLE_TABLET | ORAL | Status: DC

## 2018-04-28 MED ORDER — HEPARIN (PORCINE) IN NACL 1000-0.9 UT/500ML-% IV SOLN
INTRAVENOUS | Status: AC
  Filled 2018-04-28: qty 500

## 2018-04-28 MED ORDER — DIAZEPAM 5 MG PO TABS: 5.0000 mg | ORAL_TABLET | ORAL | Status: DC | PRN

## 2018-04-28 MED ORDER — SODIUM CHLORIDE 0.9% FLUSH: 3.0000 mL | Freq: Two times a day (BID) | INTRAVENOUS | Status: DC

## 2018-04-28 MED ORDER — ACETAMINOPHEN 325 MG PO TABS: 650.0000 mg | ORAL_TABLET | ORAL | Status: DC | PRN

## 2018-04-28 MED ORDER — ASPIRIN 81 MG PO CHEW: 81.0000 mg | CHEWABLE_TABLET | Freq: Every day | ORAL | Status: DC

## 2018-04-28 MED ORDER — ONDANSETRON HCL 4 MG/2ML IJ SOLN: 4.0000 mg | Freq: Four times a day (QID) | INTRAMUSCULAR | Status: DC | PRN

## 2018-04-28 MED ORDER — LIDOCAINE HCL (PF) 1 % IJ SOLN
INTRAMUSCULAR | Status: DC | PRN
  Administered 2018-04-28: 2 mL
  Administered 2018-04-28: 20 mL

## 2018-04-28 MED ORDER — HEPARIN (PORCINE) IN NACL 1000-0.9 UT/500ML-% IV SOLN
INTRAVENOUS | Status: DC | PRN
  Administered 2018-04-28 (×2): 500 mL

## 2018-04-28 MED ORDER — MIDAZOLAM HCL 2 MG/2ML IJ SOLN
INTRAMUSCULAR | Status: DC | PRN
  Administered 2018-04-28 (×2): 1 mg via INTRAVENOUS

## 2018-04-28 MED ORDER — SODIUM CHLORIDE 0.9 % IV SOLN: 250.0000 mL | INTRAVENOUS | Status: DC | PRN

## 2018-04-28 MED ORDER — LIDOCAINE HCL (PF) 1 % IJ SOLN
INTRAMUSCULAR | Status: AC
  Filled 2018-04-28: qty 30

## 2018-04-28 SURGICAL SUPPLY — 11 items
CATH INFINITI 5FR MULTPACK ANG (CATHETERS) ×2 IMPLANT
CATH SWAN GANZ 7F STRAIGHT (CATHETERS) ×2 IMPLANT
GLIDESHEATH SLEND SS 6F .021 (SHEATH) ×2 IMPLANT
KIT HEART LEFT (KITS) ×2 IMPLANT
PACK CARDIAC CATHETERIZATION (CUSTOM PROCEDURE TRAY) ×2 IMPLANT
SHEATH GLIDE SLENDER 4/5FR (SHEATH) ×4 IMPLANT
SHEATH PINNACLE 5F 10CM (SHEATH) ×2 IMPLANT
SHEATH PINNACLE 7F 10CM (SHEATH) ×2 IMPLANT
TRANSDUCER W/STOPCOCK (MISCELLANEOUS) ×2 IMPLANT
TUBING CIL FLEX 10 FLL-RA (TUBING) ×2 IMPLANT
WIRE EMERALD 3MM-J .035X150CM (WIRE) ×2 IMPLANT

## 2018-04-28 NOTE — Interval H&P Note (Signed)
Cath Lab Visit (complete for each Cath Lab visit)  Clinical Evaluation Leading to the Procedure:   ACS: No.  Non-ACS:    Anginal Classification: CCS III  Anti-ischemic medical therapy: Minimal Therapy (1 class of medications)  Non-Invasive Test Results: No non-invasive testing performed  Prior CABG: No previous CABG      History and Physical Interval Note:  04/28/2018 8:49 AM  Logan Mclean  has presented today for surgery, with the diagnosis of cad  The various methods of treatment have been discussed with the patient and family. After consideration of risks, benefits and other options for treatment, the patient has consented to  Procedure(s): RIGHT/LEFT HEART CATH AND CORONARY ANGIOGRAPHY (N/A) as a surgical intervention .  The patient's history has been reviewed, patient examined, no change in status, stable for surgery.  I have reviewed the patient's chart and labs.  Questions were answered to the patient's satisfaction.     Shelva Majestic

## 2018-04-28 NOTE — Progress Notes (Addendum)
Site area: RFA/RFV Site Prior to Removal:  Level 0/0 Pressure Applied For: 45 min Manual:   yes Patient Status During Pull:  stable Post Pull Site:  Level 0/0 Post Pull Instructions Given:  yes Post Pull Pulses Present: palpable Dressing Applied:  clear Bedrest begins @ 1100 till 1500 Comments:

## 2018-04-28 NOTE — Discharge Instructions (Signed)

## 2018-04-29 ENCOUNTER — Encounter (HOSPITAL_COMMUNITY): Payer: Self-pay | Admitting: Cardiovascular Disease

## 2018-05-26 ENCOUNTER — Ambulatory Visit: Payer: Medicare Other | Admitting: Internal Medicine

## 2018-05-26 ENCOUNTER — Encounter: Payer: Self-pay | Admitting: Internal Medicine

## 2018-05-26 ENCOUNTER — Encounter (INDEPENDENT_AMBULATORY_CARE_PROVIDER_SITE_OTHER): Payer: Self-pay

## 2018-05-26 VITALS — BP 117/67 | HR 58 | Ht 66.0 in | Wt 174.6 lb

## 2018-05-26 DIAGNOSIS — I421 Obstructive hypertrophic cardiomyopathy: Secondary | ICD-10-CM | POA: Diagnosis not present

## 2018-05-26 DIAGNOSIS — R0789 Other chest pain: Secondary | ICD-10-CM

## 2018-05-26 DIAGNOSIS — R0602 Shortness of breath: Secondary | ICD-10-CM | POA: Diagnosis not present

## 2018-05-26 NOTE — Progress Notes (Signed)
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. He has a history of hypertrophic obstructive cardiomyopathy, dyslipidemia and a history of prostate cancer in the past. He has a history of complex coronary artery disease. In 2005 he underwent cardiac catheterization and under ultimately had drug-eluting stenting x3 of the LAD and was noted to have a dominant right coronary moderate size circumflex artery. His last catheterization was in 2006 and since then has had lower stress test. He also has significant diastolic heart failure.  He was seen by myself in the emergency department last year for acute on chronic diastolic heart failure.  He is stage II diastolic dysfunction on his echo and responded to diuresis. Echo does have suggested that the areas CM and at least a 41 mm outflow tract gradient with valsalva.  This appears to be fairly stable. His weight on discharge from the hospital last year was 168 pounds and is stable today.  He denies any worsening shortness of breath and only reports a small amount of lower extremity swelling.  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 He reported some fatigue and lack of energy. He was noted to be bradycardic today with a long first degree AV block in the 40s. Blood pressure was 100/52. I recommended discontinuing his beta blocker. Since doing that he's had a marked improvement in his energy level. His blood pressure today was 120/82 when I rechecked it. He had not taken his lisinopril this morning however it is low dose at 2.5 mg. 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  He is generally without complaints. 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 He denies any chest pain or worsening shortness of breath. He has been battling prostate  cancer and underwent external radiation and still has an elevated PSA. He's currently taking diuretics only as  Needed, especially when he eats salt. He was on both beta blocker and calcium channel blocker however the beta blocker was discontinued by myself due to symptomatic bradycardia. He had marked improvement in his symptoms when his heart rate has come up into the 60s. EKG today shows sinus rhythm with a PVC.  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 He is without complaints. He denies any chest pain, worsening shortness of breath, presyncope or syncopal symptoms. His last office visit we ordered an echocardiogram which shows a stable LVOT gradient assistant with hypertrophic cardiomyopathy. He has EKG changes which are stable. Blood pressure is well-controlled today. His only notable finding was that recently his PSA had been elevated. He is concerned about this and has a follow-up with [name] in the near future.  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 He is main concern is that he is bleeding very easily. He apparently caught his left elbow on a prior and this was covered with a bandage today. The gauze bandages completely soaked with blood although he had a very small puncture wound. I redressed this and the office and we [place] pressure on it. He says this is been a problem since been on aspirin and Plavix. He says he does get some pressure in his chest particularly after eating. He denies any chest pain, presyncope or syncopal symptoms with exertion. Of note we've been following  his echocardiogram which showed a stable LVOT gradient in the past. His last echo was 2016.  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 Over the past 6 months he's done very well. 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 He had an echo in November 2017 which did show an increased LVOT gradient 64 mmHg. Despite this he is not  having any new symptoms. Laboratory work is been stable. Cholesterol is at goal with last LDL 55 on atorvastatin 40 mg.  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  It has been about 8 months since I last saw him.  He underwent a recent echo that showed a rest LVOT gradient of 45 mmHg with LVEF 65-70% and severe asymmetric septal hypertrophy consistent with his known diagnosis of hypertrophic cardiomyopathy.  His outflow tract gradient at rest is higher than previously reported.  I asked him at length today whether he was having any new symptoms.  He says he may be a little bit more fatigue but overall he is doing really well.  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  He denies any presyncopal or syncopal events.  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  He reports recently he has had some progressive discomfort in the chest with exertion.  He noted moving heavier objects or carrying things is starting to cause some tightness in his chest.  It seems to resolve with rest.  There is some mild associated shortness of breath.  He felt like this might be more consistent with progression of his hypertrophic cardiomyopathy.  As mentioned previously his last stress gradient was a 45 mmHg.  LVEF was 65 to 70%.  He does also have a history of coronary disease which is remote.  An EKG performed today shows sinus rhythm with first-degree AV block, LVH with repolarization.  Blood pressure is well controlled.  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  He underwent an echocardiogram to further evaluate hypertrophic cardiomyopathy and his progressive shortness of breath and chest discomfort.  The echo does show significant increase in LVOT gradient with a peak gradient of 66 mmHg at rest that increased up to 71 mmHg with Valsalva.  LVEF was vigorous at 65 to 70% with no regional wall motion abnormalities and grade 2  diastolic dysfunction.  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  He underwent left and right heart catheterization on 04/28/2018 by Dr. Claiborne Billings.  This demonstrated no significant residual coronary disease with very mild nonobstructive findings.  He did however have significant hypertrophic obstructive cardiomyopathy with an LVOT gradient of 87 mmHg and post PVC gradient of 135 mmHg.  LVEDP was elevated 24 to 26 mmHg.  I discussed those findings more with him today and I am concerned this is the cause of his worsening shortness of breath, fatigue and chest discomfort, particularly with exertion.  The invasive findings are worse than seen on echo.  My suspicion is that he will need intervention with either surgical myomectomy or alcohol septal ablation sooner than later.  PMHx:  Past Medical History:  Diagnosis Date  . Acute CHF ([place]) 10/29/2012  . BPH (benign prostatic hyperplasia)   . Cardiomyopathy ([place])   . Coronary artery disease    Stents  . Family history of anesthesia complication  mother had problems"   . H/O cardiac catheterization    several with stents  . Heart murmur   . Hemorrhoids   . Hx of radiation therapy 04/29/12- 06/28/12   prostate 78 gray in 40 tx  . Hyperlipidemia   . IHSS (idiopathic hypertrophic subaortic stenosis) ([place]) 2006  . Obstructive uropathy   . Prostate cancer ([place]) 02/19/12   Adenocarcinoa,3+4=7,(PSA=8.0 on 11/11/11  . PSA elevation 2011   5.4   . Shortness of breath     Past Surgical History:  Procedure Laterality Date  . BAND HEMORRHOIDECTOMY    . CARDIAC CATHETERIZATION  05/10/2004   CAD with crescendo angina, high-grade prox mid and mid-distal LAD with DES 3.5x87mm Cordis Cypher DES(Dr. [name])   . CARDIAC CATHETERIZATION  03/25/2005   DES X3 (3x5x58mm, 3x39mm, 2.5x68mm) of LAD, single-vessel diease with dominant right and moderate sized Cfx (Dr. [name])   . COLONOSCOPY    . HERNIA REPAIR Bilateral     inguinal  . NM MYOCAR PERF WALL MOTION  07/03/2011   lexiscan myoview; perfusion defect in inferior myocardium (diaphragmatic attenuation), remaining myocardium with normal perfusion; post-stress EF 56%  . PROSTATE BIOPSY  02/19/2012   Procedure: PROSTATE BIOPSY;  Surgeon: [name], MD;  Location: [facility];  Service: Urology;  Laterality: N/A;  . RIGHT/LEFT HEART CATH AND CORONARY ANGIOGRAPHY N/A 04/28/2018   Procedure: RIGHT/LEFT HEART CATH AND CORONARY ANGIOGRAPHY;  Surgeon: Troy Sine, MD;  Location: [facility];  Service: Cardiovascular;  Laterality: N/A;  . TRANSTHORACIC ECHOCARDIOGRAM  08/26/2012   EF 55-65%, mod conc LVH, grade 1 diastolic dysfunction; ventricular septum mod increased; mild AV regurg; mod MR; tricuspid valve prolpase    FAMHx:  Family History  Problem Relation Age of Onset  . Colon cancer Mother 20  . Brain cancer Daughter 50       deceased, diabetes  . Heart attack Father   . Stroke Maternal Grandmother   . Colon cancer Sister   . Heart attack Brother 31  . Heart attack Brother        1st MI at 31    SOCHx:   reports that he quit smoking about 34 years ago. He quit smokeless tobacco use about 32 years ago.  His smokeless tobacco use included chew. He reports current alcohol use. He reports that he does not use drugs.  ALLERGIES:  Allergies  Allergen Reactions  . Compazine [Prochlorperazine] Hives    ROS: Pertinent items noted in HPI and remainder of comprehensive ROS otherwise negative.  HOME MEDS: Current Outpatient Medications  Medication Sig Dispense Refill  . aspirin EC 81 MG tablet Take 81 mg by mouth at bedtime.    Marland Kitchen atorvastatin (LIPITOR) 40 MG tablet TAKE ONE TABLET BY MOUTH DAILY (Patient taking differently: Take 40 mg by mouth every evening. ) 90 tablet 2  . calcium carbonate (CALCIUM 600) 600 MG TABS tablet Take 600 mg by mouth every evening.     . Cholecalciferol (VITAMIN D3) 50 MCG (2000 UT) TABS Take 4,000 Units by  mouth every evening.    . Degarelix Acetate (FIRMAGON [place]) Inject into the skin every 30 (thirty) days.    . Denosumab (XGEVA [place]) Inject into the skin every 30 (thirty) days.    . furosemide (LASIX) 40 MG tablet Take 0.5 tablets (20 mg total) by mouth daily as needed. (Patient taking differently: Take 10-20 mg by mouth daily as needed (for respiratory/breathing issues.). ) 30 tablet 7  . lisinopril (  PRINIVIL,ZESTRIL) 2.5 MG tablet TAKE ONE TABLET BY MOUTH DAILY (Patient taking differently: Take 2.5 mg by mouth every evening. ) 90 tablet 3  . Omega-3 Fatty Acids (FISH OIL) 1000 MG CAPS Take 1,000 mg by mouth every evening.    . silodosin (RAPAFLO) 8 MG CAPS capsule Take 8 mg by mouth at bedtime.     . verapamil (VERELAN PM) 120 MG 24 hr capsule TAKE 1 CAPSULE (120 MG TOTAL) BY MOUTH AT BEDTIME. (Patient taking differently: Take 120 mg by mouth at bedtime. ) 90 capsule 3   No current facility-administered medications for this visit.     LABS/IMAGING: No results found for this or any previous visit (from the past 48 hour(s)). No results found.  VITALS: BP 117/67   Pulse (!) 58   Ht 5\' 6"  (1.676 m)   Wt 174 lb 9.6 oz (79.2 kg)   BMI 28.18 kg/m   EXAM: Deferred  EKG: Deferred  ASSESSMENT: 1. Exertional chest pressure -no significant coronary disease 2. Cath derived HOCM gradient of 87 mmHg rest, increase to 135 mmHg post PVC 3. Hypertrophic cardiomyopathy-45 mmHg rest gradient (2018) EF 65-70% 4. Coronary artery disease with DES x3 the LAD (2004) 5. Hypertension 6. Dyslipidemia 7. Chronic diastolic heart failure 8. History of prostate cancer 9. Symptomatic bradycardia on B-blocker - improved  PLAN: 1.   [name] has significant LVOT obstruction which she is symptomatic.  This is been monitored closely over several years and continues to worsen.  At this point I think he will need consideration for surgery, with either myomectomy or alcohol septal ablation.  We will plan  referral to Dr. Mina Marble at [facility] for further evaluation to present options to him.  He is agreeable with this plan.  Follow-up with me afterward.  Pixie Casino, MD, [facility] [place] Director of the Advanced Lipid Disorders &  Cardiovascular Risk Reduction Clinic Attending Cardiologist  Direct Dial: [phone]  Fax: [phone]  Website:  [email or web address]  05/26/2018, 10:39 AM

## 2018-05-26 NOTE — Patient Instructions (Signed)
Medication Instructions:  Continue current medications If you need a refill on your cardiac medications before your next appointment, please call your pharmacy.   Follow-Up: At [facility], you and your health needs are our priority.  As part of our continuing mission to provide you with exceptional heart care, we have created designated Provider Care Teams.  These Care Teams include your primary Cardiologist (physician) and Advanced Practice Providers (APPs -  Physician Assistants and Nurse Practitioners) who all work together to provide you with the care you need, when you need it. You will need a follow up appointment in 3-4 months with Dr. Debara Pickett.   You have been referred to Dr. Derinda Sis - [facility] - to discuss hypertrophic obstructive cardiomyopathy

## 2018-06-23 ENCOUNTER — Telehealth: Payer: Self-pay | Admitting: Internal Medicine

## 2018-06-23 NOTE — Telephone Encounter (Signed)
Patient called wanting to know if Dr. Debara Pickett would be able to give him a handicap parking permit.

## 2018-06-23 NOTE — Telephone Encounter (Signed)
Spoke with pt who states he is needing a handicap sticker. Informed pt that Dr. Debara Pickett and his nurse is not in the office today but will forward a message to his Nurse. Pt verbalized understanding.

## 2018-06-24 NOTE — Telephone Encounter (Signed)
Handicap placard form printed - to be signed

## 2018-06-24 NOTE — Telephone Encounter (Signed)
MD signed handicap placard form - temporary, lasting 6 months. Reason: cannot walk 231feet without stopping to rest.   Patient called and notified. He would like this mailed.

## 2018-07-12 ENCOUNTER — Other Ambulatory Visit: Payer: Self-pay | Admitting: Internal Medicine

## 2018-07-12 NOTE — Telephone Encounter (Signed)
Rx request sent to pharmacy.  

## 2018-07-14 ENCOUNTER — Other Ambulatory Visit: Payer: Self-pay | Admitting: Internal Medicine

## 2018-10-12 ENCOUNTER — Other Ambulatory Visit: Payer: Self-pay | Admitting: Internal Medicine

## 2019-03-15 ENCOUNTER — Ambulatory Visit (HOSPITAL_COMMUNITY): Payer: Medicare Other

## 2019-03-15 ENCOUNTER — Ambulatory Visit (HOSPITAL_COMMUNITY)
Admission: RE | Admit: 2019-03-15 | Discharge: 2019-03-15 | Disposition: A | Discharge status: Home / Self Care | Payer: Medicare Other | Source: Ambulatory Visit | Attending: Orthopedic Surgery | Admitting: Orthopedic Surgery

## 2019-03-15 ENCOUNTER — Other Ambulatory Visit: Payer: Self-pay

## 2019-03-15 ENCOUNTER — Other Ambulatory Visit (HOSPITAL_COMMUNITY): Payer: Self-pay | Admitting: Orthopedic Surgery

## 2019-03-15 DIAGNOSIS — M79605 Pain in left leg: Secondary | ICD-10-CM | POA: Diagnosis not present

## 2019-03-15 DIAGNOSIS — Z20822 Contact with and (suspected) exposure to covid-19: Secondary | ICD-10-CM

## 2019-03-15 DIAGNOSIS — M7989 Other specified soft tissue disorders: Secondary | ICD-10-CM

## 2019-03-15 NOTE — Progress Notes (Signed)
Left lower extremity venous duplex completed. Refer to "CV Proc" under chart review to view preliminary results.  03/15/2019 12:55 PM Maudry Mayhew, MHA, RVT, RDCS, RDMS

## 2019-03-16 LAB — NOVEL CORONAVIRUS, NAA: SARS-CoV-2, NAA: NOT DETECTED

## 2019-04-10 ENCOUNTER — Other Ambulatory Visit: Payer: Self-pay | Admitting: Internal Medicine

## 2019-06-08 ENCOUNTER — Other Ambulatory Visit: Payer: Self-pay | Admitting: Internal Medicine

## 2019-07-16 ENCOUNTER — Other Ambulatory Visit: Payer: Self-pay | Admitting: Internal Medicine

## 2019-07-26 ENCOUNTER — Other Ambulatory Visit: Payer: Self-pay | Admitting: Internal Medicine

## 2019-07-27 ENCOUNTER — Other Ambulatory Visit: Payer: Self-pay

## 2019-07-27 MED ORDER — ATORVASTATIN CALCIUM 40 MG PO TABS: 40.0000 mg | ORAL_TABLET | Freq: Every day | ORAL | 0 refills | Status: DC

## 2019-08-20 ENCOUNTER — Other Ambulatory Visit: Payer: Self-pay | Admitting: Internal Medicine

## 2019-09-04 ENCOUNTER — Other Ambulatory Visit: Payer: Self-pay | Admitting: Internal Medicine

## 2019-09-24 ENCOUNTER — Other Ambulatory Visit: Payer: Self-pay | Admitting: Internal Medicine

## 2019-10-19 ENCOUNTER — Other Ambulatory Visit: Payer: Self-pay | Admitting: Internal Medicine

## 2019-11-14 ENCOUNTER — Other Ambulatory Visit: Payer: Self-pay | Admitting: Internal Medicine

## 2019-11-18 ENCOUNTER — Other Ambulatory Visit: Payer: Self-pay | Admitting: Internal Medicine

## 2019-11-28 LAB — COLOGUARD: COLOGUARD: NEGATIVE

## 2020-02-11 ENCOUNTER — Other Ambulatory Visit: Payer: Self-pay | Admitting: Internal Medicine

## 2020-04-11 DIAGNOSIS — C61 Malignant neoplasm of prostate: Secondary | ICD-10-CM | POA: Diagnosis not present

## 2020-04-11 DIAGNOSIS — C7951 Secondary malignant neoplasm of bone: Secondary | ICD-10-CM | POA: Diagnosis not present

## 2020-04-24 DIAGNOSIS — Z5111 Encounter for antineoplastic chemotherapy: Secondary | ICD-10-CM | POA: Diagnosis not present

## 2020-04-24 DIAGNOSIS — C61 Malignant neoplasm of prostate: Secondary | ICD-10-CM | POA: Diagnosis not present

## 2020-04-30 DIAGNOSIS — Z20828 Contact with and (suspected) exposure to other viral communicable diseases: Secondary | ICD-10-CM | POA: Diagnosis not present

## 2020-04-30 DIAGNOSIS — R0902 Hypoxemia: Secondary | ICD-10-CM | POA: Diagnosis not present

## 2020-04-30 DIAGNOSIS — J9601 Acute respiratory failure with hypoxia: Secondary | ICD-10-CM | POA: Diagnosis not present

## 2020-04-30 DIAGNOSIS — R059 Cough, unspecified: Secondary | ICD-10-CM | POA: Diagnosis not present

## 2020-04-30 DIAGNOSIS — J209 Acute bronchitis, unspecified: Secondary | ICD-10-CM | POA: Diagnosis not present

## 2020-05-24 DIAGNOSIS — C7951 Secondary malignant neoplasm of bone: Secondary | ICD-10-CM | POA: Diagnosis not present

## 2020-05-24 DIAGNOSIS — C61 Malignant neoplasm of prostate: Secondary | ICD-10-CM | POA: Diagnosis not present

## 2020-06-04 DIAGNOSIS — C61 Malignant neoplasm of prostate: Secondary | ICD-10-CM | POA: Diagnosis not present

## 2020-07-04 DIAGNOSIS — Z5111 Encounter for antineoplastic chemotherapy: Secondary | ICD-10-CM | POA: Diagnosis not present

## 2020-07-04 DIAGNOSIS — C61 Malignant neoplasm of prostate: Secondary | ICD-10-CM | POA: Diagnosis not present

## 2020-07-11 DIAGNOSIS — C7951 Secondary malignant neoplasm of bone: Secondary | ICD-10-CM | POA: Diagnosis not present

## 2020-07-11 DIAGNOSIS — C61 Malignant neoplasm of prostate: Secondary | ICD-10-CM | POA: Diagnosis not present

## 2020-08-03 DIAGNOSIS — C7951 Secondary malignant neoplasm of bone: Secondary | ICD-10-CM | POA: Diagnosis not present

## 2020-08-03 DIAGNOSIS — C61 Malignant neoplasm of prostate: Secondary | ICD-10-CM | POA: Diagnosis not present

## 2020-08-07 DIAGNOSIS — C61 Malignant neoplasm of prostate: Secondary | ICD-10-CM | POA: Diagnosis not present

## 2020-08-07 DIAGNOSIS — Z5111 Encounter for antineoplastic chemotherapy: Secondary | ICD-10-CM | POA: Diagnosis not present

## 2020-08-16 DIAGNOSIS — C61 Malignant neoplasm of prostate: Secondary | ICD-10-CM | POA: Diagnosis not present

## 2020-08-22 DIAGNOSIS — C7951 Secondary malignant neoplasm of bone: Secondary | ICD-10-CM | POA: Diagnosis not present

## 2020-08-22 DIAGNOSIS — R351 Nocturia: Secondary | ICD-10-CM | POA: Diagnosis not present

## 2020-08-22 DIAGNOSIS — N401 Enlarged prostate with lower urinary tract symptoms: Secondary | ICD-10-CM | POA: Diagnosis not present

## 2020-08-22 DIAGNOSIS — C61 Malignant neoplasm of prostate: Secondary | ICD-10-CM | POA: Diagnosis not present

## 2020-08-30 ENCOUNTER — Other Ambulatory Visit (HOSPITAL_COMMUNITY): Payer: Self-pay | Admitting: Urology

## 2020-08-30 DIAGNOSIS — C7951 Secondary malignant neoplasm of bone: Secondary | ICD-10-CM

## 2020-08-30 DIAGNOSIS — C61 Malignant neoplasm of prostate: Secondary | ICD-10-CM

## 2020-08-30 DIAGNOSIS — R9721 Rising PSA following treatment for malignant neoplasm of prostate: Secondary | ICD-10-CM

## 2020-09-06 DIAGNOSIS — C61 Malignant neoplasm of prostate: Secondary | ICD-10-CM | POA: Diagnosis not present

## 2020-09-06 DIAGNOSIS — C7951 Secondary malignant neoplasm of bone: Secondary | ICD-10-CM | POA: Diagnosis not present

## 2020-09-12 ENCOUNTER — Other Ambulatory Visit: Payer: Self-pay

## 2020-09-12 ENCOUNTER — Ambulatory Visit (HOSPITAL_COMMUNITY)
Admission: RE | Admit: 2020-09-12 | Discharge: 2020-09-12 | Disposition: A | Discharge status: Home / Self Care | Payer: Medicare PPO | Source: Ambulatory Visit | Attending: Urology | Admitting: Urology

## 2020-09-12 DIAGNOSIS — R9721 Rising PSA following treatment for malignant neoplasm of prostate: Secondary | ICD-10-CM | POA: Insufficient documentation

## 2020-09-12 DIAGNOSIS — C7951 Secondary malignant neoplasm of bone: Secondary | ICD-10-CM | POA: Diagnosis not present

## 2020-09-12 DIAGNOSIS — C61 Malignant neoplasm of prostate: Secondary | ICD-10-CM | POA: Diagnosis not present

## 2020-09-12 MED ORDER — PIFLIFOLASTAT F 18 (PYLARIFY) INJECTION
9.0000 | Freq: Once | INTRAVENOUS | Status: AC
  Administered 2020-09-12: 9.5 via INTRAVENOUS

## 2020-10-09 DIAGNOSIS — C61 Malignant neoplasm of prostate: Secondary | ICD-10-CM | POA: Diagnosis not present

## 2020-10-09 DIAGNOSIS — C7951 Secondary malignant neoplasm of bone: Secondary | ICD-10-CM | POA: Diagnosis not present

## 2020-11-07 DIAGNOSIS — R9721 Rising PSA following treatment for malignant neoplasm of prostate: Secondary | ICD-10-CM | POA: Diagnosis not present

## 2020-11-07 DIAGNOSIS — C7951 Secondary malignant neoplasm of bone: Secondary | ICD-10-CM | POA: Diagnosis not present

## 2020-11-07 DIAGNOSIS — C61 Malignant neoplasm of prostate: Secondary | ICD-10-CM | POA: Diagnosis not present

## 2020-11-15 DIAGNOSIS — L570 Actinic keratosis: Secondary | ICD-10-CM | POA: Diagnosis not present

## 2020-11-15 DIAGNOSIS — L820 Inflamed seborrheic keratosis: Secondary | ICD-10-CM | POA: Diagnosis not present

## 2020-12-05 DIAGNOSIS — C61 Malignant neoplasm of prostate: Secondary | ICD-10-CM | POA: Diagnosis not present

## 2020-12-05 DIAGNOSIS — C7951 Secondary malignant neoplasm of bone: Secondary | ICD-10-CM | POA: Diagnosis not present

## 2020-12-26 DIAGNOSIS — C61 Malignant neoplasm of prostate: Secondary | ICD-10-CM | POA: Diagnosis not present

## 2020-12-26 DIAGNOSIS — C7951 Secondary malignant neoplasm of bone: Secondary | ICD-10-CM | POA: Diagnosis not present

## 2021-01-08 DIAGNOSIS — C7951 Secondary malignant neoplasm of bone: Secondary | ICD-10-CM | POA: Diagnosis not present

## 2021-01-08 DIAGNOSIS — C61 Malignant neoplasm of prostate: Secondary | ICD-10-CM | POA: Diagnosis not present

## 2021-01-14 DIAGNOSIS — I422 Other hypertrophic cardiomyopathy: Secondary | ICD-10-CM | POA: Diagnosis not present

## 2021-01-14 DIAGNOSIS — I4819 Other persistent atrial fibrillation: Secondary | ICD-10-CM | POA: Diagnosis not present

## 2021-01-14 DIAGNOSIS — I2510 Atherosclerotic heart disease of native coronary artery without angina pectoris: Secondary | ICD-10-CM | POA: Diagnosis not present

## 2021-02-06 DIAGNOSIS — C61 Malignant neoplasm of prostate: Secondary | ICD-10-CM | POA: Diagnosis not present

## 2021-02-07 ENCOUNTER — Other Ambulatory Visit: Payer: Self-pay | Admitting: Internal Medicine

## 2021-02-12 ENCOUNTER — Other Ambulatory Visit: Payer: Self-pay | Admitting: Internal Medicine

## 2021-02-12 DIAGNOSIS — C7951 Secondary malignant neoplasm of bone: Secondary | ICD-10-CM | POA: Diagnosis not present

## 2021-02-12 DIAGNOSIS — C61 Malignant neoplasm of prostate: Secondary | ICD-10-CM | POA: Diagnosis not present

## 2021-02-27 ENCOUNTER — Other Ambulatory Visit: Payer: Self-pay | Admitting: Internal Medicine

## 2021-03-01 DIAGNOSIS — N401 Enlarged prostate with lower urinary tract symptoms: Secondary | ICD-10-CM | POA: Diagnosis not present

## 2021-03-01 DIAGNOSIS — C7951 Secondary malignant neoplasm of bone: Secondary | ICD-10-CM | POA: Diagnosis not present

## 2021-03-01 DIAGNOSIS — C61 Malignant neoplasm of prostate: Secondary | ICD-10-CM | POA: Diagnosis not present

## 2021-03-01 DIAGNOSIS — R351 Nocturia: Secondary | ICD-10-CM | POA: Diagnosis not present

## 2021-03-19 DIAGNOSIS — C7951 Secondary malignant neoplasm of bone: Secondary | ICD-10-CM | POA: Diagnosis not present

## 2021-03-19 DIAGNOSIS — C61 Malignant neoplasm of prostate: Secondary | ICD-10-CM | POA: Diagnosis not present

## 2021-04-23 DIAGNOSIS — C7951 Secondary malignant neoplasm of bone: Secondary | ICD-10-CM | POA: Diagnosis not present

## 2021-04-23 DIAGNOSIS — C61 Malignant neoplasm of prostate: Secondary | ICD-10-CM | POA: Diagnosis not present

## 2021-05-21 DIAGNOSIS — C7951 Secondary malignant neoplasm of bone: Secondary | ICD-10-CM | POA: Diagnosis not present

## 2021-05-21 DIAGNOSIS — C61 Malignant neoplasm of prostate: Secondary | ICD-10-CM | POA: Diagnosis not present

## 2021-05-29 DIAGNOSIS — N401 Enlarged prostate with lower urinary tract symptoms: Secondary | ICD-10-CM | POA: Diagnosis not present

## 2021-05-29 DIAGNOSIS — R3915 Urgency of urination: Secondary | ICD-10-CM | POA: Diagnosis not present

## 2021-05-29 DIAGNOSIS — C7951 Secondary malignant neoplasm of bone: Secondary | ICD-10-CM | POA: Diagnosis not present

## 2021-05-29 DIAGNOSIS — C61 Malignant neoplasm of prostate: Secondary | ICD-10-CM | POA: Diagnosis not present

## 2021-06-18 DIAGNOSIS — C61 Malignant neoplasm of prostate: Secondary | ICD-10-CM | POA: Diagnosis not present

## 2021-06-18 DIAGNOSIS — C7951 Secondary malignant neoplasm of bone: Secondary | ICD-10-CM | POA: Diagnosis not present

## 2021-07-17 DIAGNOSIS — C61 Malignant neoplasm of prostate: Secondary | ICD-10-CM | POA: Diagnosis not present

## 2021-07-17 DIAGNOSIS — C7951 Secondary malignant neoplasm of bone: Secondary | ICD-10-CM | POA: Diagnosis not present

## 2021-08-22 DIAGNOSIS — C61 Malignant neoplasm of prostate: Secondary | ICD-10-CM | POA: Diagnosis not present

## 2021-08-23 DIAGNOSIS — C61 Malignant neoplasm of prostate: Secondary | ICD-10-CM | POA: Diagnosis not present

## 2021-08-23 DIAGNOSIS — C7951 Secondary malignant neoplasm of bone: Secondary | ICD-10-CM | POA: Diagnosis not present

## 2021-08-28 DIAGNOSIS — R351 Nocturia: Secondary | ICD-10-CM | POA: Diagnosis not present

## 2021-08-28 DIAGNOSIS — N401 Enlarged prostate with lower urinary tract symptoms: Secondary | ICD-10-CM | POA: Diagnosis not present

## 2021-08-28 DIAGNOSIS — C61 Malignant neoplasm of prostate: Secondary | ICD-10-CM | POA: Diagnosis not present

## 2021-08-28 DIAGNOSIS — C7951 Secondary malignant neoplasm of bone: Secondary | ICD-10-CM | POA: Diagnosis not present

## 2021-09-17 DIAGNOSIS — C61 Malignant neoplasm of prostate: Secondary | ICD-10-CM | POA: Diagnosis not present

## 2021-09-17 DIAGNOSIS — I422 Other hypertrophic cardiomyopathy: Secondary | ICD-10-CM | POA: Diagnosis not present

## 2021-09-17 DIAGNOSIS — Z6830 Body mass index (BMI) 30.0-30.9, adult: Secondary | ICD-10-CM | POA: Diagnosis not present

## 2021-09-17 DIAGNOSIS — C7951 Secondary malignant neoplasm of bone: Secondary | ICD-10-CM | POA: Diagnosis not present

## 2021-09-17 DIAGNOSIS — I4891 Unspecified atrial fibrillation: Secondary | ICD-10-CM | POA: Diagnosis not present

## 2021-09-17 DIAGNOSIS — I5022 Chronic systolic (congestive) heart failure: Secondary | ICD-10-CM | POA: Diagnosis not present

## 2021-09-17 DIAGNOSIS — I2510 Atherosclerotic heart disease of native coronary artery without angina pectoris: Secondary | ICD-10-CM | POA: Diagnosis not present

## 2021-09-17 DIAGNOSIS — R918 Other nonspecific abnormal finding of lung field: Secondary | ICD-10-CM | POA: Diagnosis not present

## 2021-09-27 DIAGNOSIS — C7951 Secondary malignant neoplasm of bone: Secondary | ICD-10-CM | POA: Diagnosis not present

## 2021-09-27 DIAGNOSIS — C61 Malignant neoplasm of prostate: Secondary | ICD-10-CM | POA: Diagnosis not present

## 2021-10-15 DIAGNOSIS — Z131 Encounter for screening for diabetes mellitus: Secondary | ICD-10-CM | POA: Diagnosis not present

## 2021-10-15 DIAGNOSIS — Z0000 Encounter for general adult medical examination without abnormal findings: Secondary | ICD-10-CM | POA: Diagnosis not present

## 2021-10-15 DIAGNOSIS — Z13220 Encounter for screening for lipoid disorders: Secondary | ICD-10-CM | POA: Diagnosis not present

## 2021-10-15 DIAGNOSIS — E559 Vitamin D deficiency, unspecified: Secondary | ICD-10-CM | POA: Diagnosis not present

## 2021-10-15 DIAGNOSIS — Z1329 Encounter for screening for other suspected endocrine disorder: Secondary | ICD-10-CM | POA: Diagnosis not present

## 2021-10-16 DIAGNOSIS — R7309 Other abnormal glucose: Secondary | ICD-10-CM | POA: Diagnosis not present

## 2021-10-16 DIAGNOSIS — Z1329 Encounter for screening for other suspected endocrine disorder: Secondary | ICD-10-CM | POA: Diagnosis not present

## 2021-10-16 DIAGNOSIS — Z131 Encounter for screening for diabetes mellitus: Secondary | ICD-10-CM | POA: Diagnosis not present

## 2021-10-16 DIAGNOSIS — Z13220 Encounter for screening for lipoid disorders: Secondary | ICD-10-CM | POA: Diagnosis not present

## 2021-10-16 DIAGNOSIS — E559 Vitamin D deficiency, unspecified: Secondary | ICD-10-CM | POA: Diagnosis not present

## 2021-11-01 DIAGNOSIS — C7951 Secondary malignant neoplasm of bone: Secondary | ICD-10-CM | POA: Diagnosis not present

## 2021-11-01 DIAGNOSIS — C61 Malignant neoplasm of prostate: Secondary | ICD-10-CM | POA: Diagnosis not present

## 2021-11-13 DIAGNOSIS — I2510 Atherosclerotic heart disease of native coronary artery without angina pectoris: Secondary | ICD-10-CM | POA: Diagnosis not present

## 2021-11-13 DIAGNOSIS — Z23 Encounter for immunization: Secondary | ICD-10-CM | POA: Diagnosis not present

## 2021-11-13 DIAGNOSIS — I5022 Chronic systolic (congestive) heart failure: Secondary | ICD-10-CM | POA: Diagnosis not present

## 2021-11-13 DIAGNOSIS — I422 Other hypertrophic cardiomyopathy: Secondary | ICD-10-CM | POA: Diagnosis not present

## 2021-11-13 DIAGNOSIS — C61 Malignant neoplasm of prostate: Secondary | ICD-10-CM | POA: Diagnosis not present

## 2021-11-13 DIAGNOSIS — R918 Other nonspecific abnormal finding of lung field: Secondary | ICD-10-CM | POA: Diagnosis not present

## 2021-11-13 DIAGNOSIS — I4891 Unspecified atrial fibrillation: Secondary | ICD-10-CM | POA: Diagnosis not present

## 2021-11-13 DIAGNOSIS — C7951 Secondary malignant neoplasm of bone: Secondary | ICD-10-CM | POA: Diagnosis not present

## 2021-12-02 DIAGNOSIS — C61 Malignant neoplasm of prostate: Secondary | ICD-10-CM | POA: Diagnosis not present

## 2021-12-02 DIAGNOSIS — N401 Enlarged prostate with lower urinary tract symptoms: Secondary | ICD-10-CM | POA: Diagnosis not present

## 2021-12-06 DIAGNOSIS — C61 Malignant neoplasm of prostate: Secondary | ICD-10-CM | POA: Diagnosis not present

## 2021-12-06 DIAGNOSIS — C7951 Secondary malignant neoplasm of bone: Secondary | ICD-10-CM | POA: Diagnosis not present

## 2021-12-09 DIAGNOSIS — R351 Nocturia: Secondary | ICD-10-CM | POA: Diagnosis not present

## 2021-12-09 DIAGNOSIS — C61 Malignant neoplasm of prostate: Secondary | ICD-10-CM | POA: Diagnosis not present

## 2021-12-09 DIAGNOSIS — N401 Enlarged prostate with lower urinary tract symptoms: Secondary | ICD-10-CM | POA: Diagnosis not present

## 2021-12-09 DIAGNOSIS — C7951 Secondary malignant neoplasm of bone: Secondary | ICD-10-CM | POA: Diagnosis not present

## 2022-01-07 DIAGNOSIS — C7951 Secondary malignant neoplasm of bone: Secondary | ICD-10-CM | POA: Diagnosis not present

## 2022-01-07 DIAGNOSIS — C61 Malignant neoplasm of prostate: Secondary | ICD-10-CM | POA: Diagnosis not present

## 2022-01-23 ENCOUNTER — Other Ambulatory Visit: Payer: Self-pay

## 2022-01-23 NOTE — Patient Outreach (Signed)
[facility]) Care Management  01/23/2022  GARELD OBRECHT 07-04-1940 [id number]   Telephone call to patient for nurse call. Patient ans wife declined nurse call.   Plan: RN CM will close case.    Jone Baseman, RN, MSN [facility] Care Management Care Management Coordinator Direct Line [phone] Toll Free: [phone]  Fax: [phone]

## 2022-02-03 DIAGNOSIS — I2510 Atherosclerotic heart disease of native coronary artery without angina pectoris: Secondary | ICD-10-CM | POA: Diagnosis not present

## 2022-02-03 DIAGNOSIS — I422 Other hypertrophic cardiomyopathy: Secondary | ICD-10-CM | POA: Diagnosis not present

## 2022-02-03 DIAGNOSIS — I4819 Other persistent atrial fibrillation: Secondary | ICD-10-CM | POA: Diagnosis not present

## 2022-02-07 DIAGNOSIS — C7951 Secondary malignant neoplasm of bone: Secondary | ICD-10-CM | POA: Diagnosis not present

## 2022-02-07 DIAGNOSIS — C61 Malignant neoplasm of prostate: Secondary | ICD-10-CM | POA: Diagnosis not present

## 2022-02-24 DIAGNOSIS — I5022 Chronic systolic (congestive) heart failure: Secondary | ICD-10-CM | POA: Diagnosis not present

## 2022-02-24 DIAGNOSIS — Z131 Encounter for screening for diabetes mellitus: Secondary | ICD-10-CM | POA: Diagnosis not present

## 2022-02-28 DIAGNOSIS — Z6829 Body mass index (BMI) 29.0-29.9, adult: Secondary | ICD-10-CM | POA: Diagnosis not present

## 2022-02-28 DIAGNOSIS — I4891 Unspecified atrial fibrillation: Secondary | ICD-10-CM | POA: Diagnosis not present

## 2022-02-28 DIAGNOSIS — I5022 Chronic systolic (congestive) heart failure: Secondary | ICD-10-CM | POA: Diagnosis not present

## 2022-02-28 DIAGNOSIS — R059 Cough, unspecified: Secondary | ICD-10-CM | POA: Diagnosis not present

## 2022-02-28 DIAGNOSIS — Z0001 Encounter for general adult medical examination with abnormal findings: Secondary | ICD-10-CM | POA: Diagnosis not present

## 2022-02-28 DIAGNOSIS — I422 Other hypertrophic cardiomyopathy: Secondary | ICD-10-CM | POA: Diagnosis not present

## 2022-02-28 DIAGNOSIS — C61 Malignant neoplasm of prostate: Secondary | ICD-10-CM | POA: Diagnosis not present

## 2022-02-28 DIAGNOSIS — I2510 Atherosclerotic heart disease of native coronary artery without angina pectoris: Secondary | ICD-10-CM | POA: Diagnosis not present

## 2022-02-28 DIAGNOSIS — C7951 Secondary malignant neoplasm of bone: Secondary | ICD-10-CM | POA: Diagnosis not present

## 2022-03-10 DIAGNOSIS — C7951 Secondary malignant neoplasm of bone: Secondary | ICD-10-CM | POA: Diagnosis not present

## 2022-03-10 DIAGNOSIS — C61 Malignant neoplasm of prostate: Secondary | ICD-10-CM | POA: Diagnosis not present

## 2022-03-13 DIAGNOSIS — R042 Hemoptysis: Secondary | ICD-10-CM | POA: Diagnosis not present

## 2022-03-13 DIAGNOSIS — I4891 Unspecified atrial fibrillation: Secondary | ICD-10-CM | POA: Diagnosis not present

## 2022-03-13 DIAGNOSIS — C61 Malignant neoplasm of prostate: Secondary | ICD-10-CM | POA: Diagnosis not present

## 2022-03-13 DIAGNOSIS — I2510 Atherosclerotic heart disease of native coronary artery without angina pectoris: Secondary | ICD-10-CM | POA: Diagnosis not present

## 2022-03-13 DIAGNOSIS — I422 Other hypertrophic cardiomyopathy: Secondary | ICD-10-CM | POA: Diagnosis not present

## 2022-03-13 DIAGNOSIS — I5022 Chronic systolic (congestive) heart failure: Secondary | ICD-10-CM | POA: Diagnosis not present

## 2022-03-13 DIAGNOSIS — Z0001 Encounter for general adult medical examination with abnormal findings: Secondary | ICD-10-CM | POA: Diagnosis not present

## 2022-03-13 DIAGNOSIS — C7951 Secondary malignant neoplasm of bone: Secondary | ICD-10-CM | POA: Diagnosis not present

## 2022-03-13 DIAGNOSIS — R059 Cough, unspecified: Secondary | ICD-10-CM | POA: Diagnosis not present

## 2022-03-17 DIAGNOSIS — R042 Hemoptysis: Secondary | ICD-10-CM | POA: Diagnosis not present

## 2022-03-17 DIAGNOSIS — J479 Bronchiectasis, uncomplicated: Secondary | ICD-10-CM | POA: Diagnosis not present

## 2022-03-17 DIAGNOSIS — J9811 Atelectasis: Secondary | ICD-10-CM | POA: Diagnosis not present

## 2022-03-19 DIAGNOSIS — C61 Malignant neoplasm of prostate: Secondary | ICD-10-CM | POA: Diagnosis not present

## 2022-03-19 DIAGNOSIS — C7951 Secondary malignant neoplasm of bone: Secondary | ICD-10-CM | POA: Diagnosis not present

## 2022-03-25 ENCOUNTER — Other Ambulatory Visit (HOSPITAL_COMMUNITY): Payer: Self-pay | Admitting: Urology

## 2022-03-25 DIAGNOSIS — C61 Malignant neoplasm of prostate: Secondary | ICD-10-CM

## 2022-04-03 ENCOUNTER — Ambulatory Visit (HOSPITAL_COMMUNITY)
Admission: RE | Admit: 2022-04-03 | Discharge: 2022-04-03 | Disposition: A | Discharge status: Home / Self Care | Payer: Medicare PPO | Source: Ambulatory Visit | Attending: Urology | Admitting: Urology

## 2022-04-03 DIAGNOSIS — C61 Malignant neoplasm of prostate: Secondary | ICD-10-CM | POA: Insufficient documentation

## 2022-04-03 DIAGNOSIS — Z8546 Personal history of malignant neoplasm of prostate: Secondary | ICD-10-CM | POA: Diagnosis not present

## 2022-04-03 MED ORDER — PIFLIFOLASTAT F 18 (PYLARIFY) INJECTION
9.0000 | Freq: Once | INTRAVENOUS | Status: AC
  Administered 2022-04-03: 9.2 via INTRAVENOUS

## 2022-04-04 ENCOUNTER — Institutional Professional Consult (permissible substitution): Payer: Medicare PPO | Admitting: Pulmonary Disease

## 2022-04-09 DIAGNOSIS — C61 Malignant neoplasm of prostate: Secondary | ICD-10-CM | POA: Diagnosis not present

## 2022-04-09 DIAGNOSIS — C7951 Secondary malignant neoplasm of bone: Secondary | ICD-10-CM | POA: Diagnosis not present

## 2022-04-16 ENCOUNTER — Institutional Professional Consult (permissible substitution): Payer: Medicare PPO | Admitting: Pulmonary Disease

## 2022-04-24 ENCOUNTER — Ambulatory Visit: Payer: Medicare PPO | Admitting: Pulmonary Disease

## 2022-04-24 ENCOUNTER — Encounter: Payer: Self-pay | Admitting: Pulmonary Disease

## 2022-04-24 VITALS — BP 110/62 | HR 80 | Temp 97.5°F | Ht 66.0 in | Wt 171.6 lb

## 2022-04-24 DIAGNOSIS — J479 Bronchiectasis, uncomplicated: Secondary | ICD-10-CM | POA: Diagnosis not present

## 2022-04-24 NOTE — Progress Notes (Signed)
[place]    [id number]    1940-11-17  Primary Care Physician:Williams, Drema [place], MD  Referring Physician: [place] Nation, MD [place],  [place] [address]  Chief complaint:   Patient with chronic cough  HPI:  Cough with sputum production Recent CT scan of the chest shows evidence of bronchiectasis  With usually cough clear to yellow phlegm  Sputum production frequently  He does try to stay active Has a history of heart rhythm problems, hypercholesterolemia history of prostate cancer, history of cardiomyopathy  Was a veterinarian  Never smoker  Does not recollect having chronic lung infections.  Outpatient Encounter Medications as of 04/24/2022  Medication Sig   atorvastatin (LIPITOR) 40 MG tablet Take 1 tablet (40 mg total) by mouth daily.   calcium carbonate (CALCIUM 600) 600 MG TABS tablet Take 600 mg by mouth every evening.    Cholecalciferol (VITAMIN D3) 50 MCG (2000 UT) TABS Take 4,000 Units by mouth every evening.   Denosumab (XGEVA [place]) Inject into the skin every 30 (thirty) days.   furosemide (LASIX) 40 MG tablet Take 0.5 tablets (20 mg total) by mouth daily as needed. (Patient taking differently: Take 10-20 mg by mouth daily as needed (for respiratory/breathing issues.).)   Omega-3 Fatty Acids (FISH OIL) 1000 MG CAPS Take 1,000 mg by mouth every evening.   Potassium Gluconate 550 (90 K) MG TABS Take by mouth.   Relugolix (ORGOVYX) 120 MG TABS Take 1 tablet by mouth daily.   rivaroxaban (XARELTO) 20 MG TABS tablet Take 20 mg by mouth daily with supper.   silodosin (RAPAFLO) 8 MG CAPS capsule Take 8 mg by mouth at bedtime.    aspirin EC 81 MG tablet Take 81 mg by mouth at bedtime. (Patient not taking: Reported on 04/24/2022)   Degarelix Acetate (FIRMAGON [place]) Inject into the skin every 30 (thirty) days. (Patient not taking: Reported on 04/24/2022)   denosumab (PROLIA) 60 MG/ML SOSY injection Inject 60 mg into the skin. (Patient not  taking: Reported on 04/24/2022)   lisinopril (PRINIVIL,ZESTRIL) 2.5 MG tablet TAKE ONE TABLET BY MOUTH DAILY (Patient not taking: Reported on 04/24/2022)   verapamil (VERELAN PM) 120 MG 24 hr capsule TAKE 1 CAPSULE (120 MG TOTAL) BY MOUTH AT BEDTIME. (Patient not taking: Reported on 04/24/2022)   No facility-administered encounter medications on file as of 04/24/2022.    Allergies as of 04/24/2022 - Review Complete 04/24/2022  Allergen Reaction Noted   Compazine [prochlorperazine] Hives 02/12/2012    Past Medical History:  Diagnosis Date   Acute CHF ([place]) 10/29/2012   BPH (benign prostatic hyperplasia)    Cardiomyopathy ([place])    Coronary artery disease    Stents   Family history of anesthesia complication    mother had problems"    H/O cardiac catheterization    several with stents   Heart murmur    Hemorrhoids    Hx of radiation therapy 04/29/12- 06/28/12   prostate 78 gray in 40 tx   Hyperlipidemia    IHSS (idiopathic hypertrophic subaortic stenosis) ([place]) 2006   Obstructive uropathy    Prostate cancer ([place]) 02/19/12   Adenocarcinoa,3+4=7,(PSA=8.0 on 11/11/11   PSA elevation 2011   5.4    Shortness of breath     Past Surgical History:  Procedure Laterality Date   BAND HEMORRHOIDECTOMY     CARDIAC CATHETERIZATION  05/10/2004   CAD with crescendo angina, high-grade prox  mid and mid-distal LAD with DES 3.5x52m Cordis Cypher DES(Dr. RMarella Chimes    CARDIAC CATHETERIZATION  03/25/2005   DES X3 (3x5x164m 3x8m34m2.5x18m68mf LAD, single-vessel diease with dominant right and moderate sized Cfx (Dr. R. WMarella Chimes COLONOSCOPY     HERNIA REPAIR Bilateral    inguinal   [place]   lexiscan myoview; perfusion defect in inferior myocardium (diaphragmatic attenuation), remaining myocardium with normal perfusion; post-stress EF 56%   PROSTATE BIOPSY  02/19/2012   Procedure: PROSTATE BIOPSY;  Surgeon: MohaMarissa Nestle;  Location: [facility];  Service:  Urology;  Laterality: N/A;   RIGHT/LEFT HEART CATH AND CORONARY ANGIOGRAPHY N/A 04/28/2018   Procedure: RIGHT/LEFT HEART CATH AND CORONARY ANGIOGRAPHY;  Surgeon: KellTroy Sine;  [facility];  Service: Cardiovascular;  Laterality: N/A;   TRANSTHORACIC ECHOCARDIOGRAM  08/26/2012   EF 55-65%, mod conc LVH, grade 1 diastolic dysfunction; ventricular septum mod increased; mild AV regurg; mod MR; tricuspid valve prolpase    Family History  Problem Relation Age of Onset   Colon cancer Mother 68  49rain cancer Daughter 30  3   deceased, diabetes   Heart attack Father    Stroke Maternal Grandmother    Colon cancer Sister    Heart attack Brother 47  5eart attack Brother        1st MI at 58  40Social History   Socioeconomic History   Marital status: Married    Spouse name: Not on file   Number of children: 1   Years of education: Not on file   Highest education level: Not on file  Occupational History    Employer: RETIRED    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, medical  Tobacco Use   Smoking status: Never   Smokeless tobacco: Former    Types: Chew    Quit date: 06/09/1985  Substance and Sexual Activity   Alcohol use: Yes    Comment: occasional   Drug use: No   Sexual activity: Never  Other Topics Concern   Not on file  Social History Narrative   Not on file   Social Determinants of Health   Financial Resource Strain: Not on file  Food Insecurity: Not on file  Transportation Needs: Not on file  Physical Activity: Not on file  Stress: Not on file  Social Connections: Not on file  Intimate Partner Violence: Not on file    Review of Systems  Respiratory:  Positive for cough and shortness of breath.     Vitals:   04/24/22 1449  BP: 110/62  Pulse: 80  Temp: (!) 97.5 F (36.4 C)  SpO2: 98%     Physical Exam Constitutional:      Appearance: He is obese.  HENT:     Head: Normocephalic.     Mouth/Throat:     Mouth: Mucous membranes are  moist.  Eyes:     Pupils: Pupils are equal, round, and reactive to light.  Cardiovascular:     Rate and Rhythm: Normal rate and regular rhythm.     Heart sounds: No murmur heard.    No friction rub.  Pulmonary:     Effort: No respiratory distress.     Breath sounds: No stridor. Rhonchi present. No wheezing.  Musculoskeletal:     Cervical back: No rigidity or tenderness.  Neurological:     Mental Status: He is alert.  Psychiatric:  Mood and Affect: Mood normal.    Data Reviewed: CT scan was reviewed showing evidence of bronchiectasis  Assessment:  Shortness of breath  Cough  Bronchiectasis on recent CT  Does not appear to have an active infection at present  Plan/Recommendations: Sputum for Gram stain and cultures  I did recommend a nebulizer with hypertonic saline to help with mucus clearance but he does not want to start this at present  We will follow-up on sputum Gram stain and cultures  Tentative follow-up in about 3 months  Encouraged to call with significant concerns     Sherrilyn Rist MD [facility] 04/24/2022, 3:11 PM  CC: [place] Nation, MD

## 2022-04-24 NOTE — Patient Instructions (Addendum)
Sputum for Gram stain and cultures and fungal cultures  Consider nebulization with hypertonic saline  I will see you back in about 3 months

## 2022-04-29 ENCOUNTER — Other Ambulatory Visit: Payer: Medicare PPO

## 2022-04-29 DIAGNOSIS — J479 Bronchiectasis, uncomplicated: Secondary | ICD-10-CM

## 2022-05-07 DIAGNOSIS — C7951 Secondary malignant neoplasm of bone: Secondary | ICD-10-CM | POA: Diagnosis not present

## 2022-05-07 DIAGNOSIS — C61 Malignant neoplasm of prostate: Secondary | ICD-10-CM | POA: Diagnosis not present

## 2022-05-08 DIAGNOSIS — R042 Hemoptysis: Secondary | ICD-10-CM | POA: Diagnosis not present

## 2022-05-08 DIAGNOSIS — M79644 Pain in right finger(s): Secondary | ICD-10-CM | POA: Diagnosis not present

## 2022-05-08 DIAGNOSIS — M67449 Ganglion, unspecified hand: Secondary | ICD-10-CM | POA: Diagnosis not present

## 2022-05-08 DIAGNOSIS — I4891 Unspecified atrial fibrillation: Secondary | ICD-10-CM | POA: Diagnosis not present

## 2022-05-08 DIAGNOSIS — M67441 Ganglion, right hand: Secondary | ICD-10-CM | POA: Diagnosis not present

## 2022-05-08 DIAGNOSIS — I2510 Atherosclerotic heart disease of native coronary artery without angina pectoris: Secondary | ICD-10-CM | POA: Diagnosis not present

## 2022-05-08 DIAGNOSIS — C61 Malignant neoplasm of prostate: Secondary | ICD-10-CM | POA: Diagnosis not present

## 2022-05-08 DIAGNOSIS — I5022 Chronic systolic (congestive) heart failure: Secondary | ICD-10-CM | POA: Diagnosis not present

## 2022-05-08 DIAGNOSIS — R059 Cough, unspecified: Secondary | ICD-10-CM | POA: Diagnosis not present

## 2022-05-08 DIAGNOSIS — C7951 Secondary malignant neoplasm of bone: Secondary | ICD-10-CM | POA: Diagnosis not present

## 2022-05-08 DIAGNOSIS — I422 Other hypertrophic cardiomyopathy: Secondary | ICD-10-CM | POA: Diagnosis not present

## 2022-05-27 LAB — FUNGUS CULTURE W SMEAR
MICRO NUMBER:: 14218523
SPECIMEN QUALITY:: ADEQUATE

## 2022-06-04 DIAGNOSIS — C7951 Secondary malignant neoplasm of bone: Secondary | ICD-10-CM | POA: Diagnosis not present

## 2022-06-04 DIAGNOSIS — C61 Malignant neoplasm of prostate: Secondary | ICD-10-CM | POA: Diagnosis not present

## 2022-06-10 DIAGNOSIS — M67441 Ganglion, right hand: Secondary | ICD-10-CM | POA: Diagnosis not present

## 2022-06-17 DIAGNOSIS — C61 Malignant neoplasm of prostate: Secondary | ICD-10-CM | POA: Diagnosis not present

## 2022-06-18 DIAGNOSIS — C61 Malignant neoplasm of prostate: Secondary | ICD-10-CM | POA: Diagnosis not present

## 2022-06-25 DIAGNOSIS — C7951 Secondary malignant neoplasm of bone: Secondary | ICD-10-CM | POA: Diagnosis not present

## 2022-06-25 DIAGNOSIS — C61 Malignant neoplasm of prostate: Secondary | ICD-10-CM | POA: Diagnosis not present

## 2022-06-25 DIAGNOSIS — C775 Secondary and unspecified malignant neoplasm of intrapelvic lymph nodes: Secondary | ICD-10-CM | POA: Diagnosis not present

## 2022-07-02 DIAGNOSIS — C7951 Secondary malignant neoplasm of bone: Secondary | ICD-10-CM | POA: Diagnosis not present

## 2022-07-02 DIAGNOSIS — C61 Malignant neoplasm of prostate: Secondary | ICD-10-CM | POA: Diagnosis not present

## 2022-07-30 DIAGNOSIS — C7951 Secondary malignant neoplasm of bone: Secondary | ICD-10-CM | POA: Diagnosis not present

## 2022-07-30 DIAGNOSIS — C61 Malignant neoplasm of prostate: Secondary | ICD-10-CM | POA: Diagnosis not present

## 2022-07-30 DIAGNOSIS — I422 Other hypertrophic cardiomyopathy: Secondary | ICD-10-CM | POA: Diagnosis not present

## 2022-07-30 DIAGNOSIS — Z1329 Encounter for screening for other suspected endocrine disorder: Secondary | ICD-10-CM | POA: Diagnosis not present

## 2022-07-30 DIAGNOSIS — Z131 Encounter for screening for diabetes mellitus: Secondary | ICD-10-CM | POA: Diagnosis not present

## 2022-08-04 DIAGNOSIS — C61 Malignant neoplasm of prostate: Secondary | ICD-10-CM | POA: Diagnosis not present

## 2022-08-04 DIAGNOSIS — C7951 Secondary malignant neoplasm of bone: Secondary | ICD-10-CM | POA: Diagnosis not present

## 2022-08-05 DIAGNOSIS — R042 Hemoptysis: Secondary | ICD-10-CM | POA: Diagnosis not present

## 2022-08-05 DIAGNOSIS — M67449 Ganglion, unspecified hand: Secondary | ICD-10-CM | POA: Diagnosis not present

## 2022-08-05 DIAGNOSIS — C61 Malignant neoplasm of prostate: Secondary | ICD-10-CM | POA: Diagnosis not present

## 2022-08-05 DIAGNOSIS — C7951 Secondary malignant neoplasm of bone: Secondary | ICD-10-CM | POA: Diagnosis not present

## 2022-08-05 DIAGNOSIS — I4891 Unspecified atrial fibrillation: Secondary | ICD-10-CM | POA: Diagnosis not present

## 2022-08-05 DIAGNOSIS — I422 Other hypertrophic cardiomyopathy: Secondary | ICD-10-CM | POA: Diagnosis not present

## 2022-08-05 DIAGNOSIS — I2510 Atherosclerotic heart disease of native coronary artery without angina pectoris: Secondary | ICD-10-CM | POA: Diagnosis not present

## 2022-08-05 DIAGNOSIS — R059 Cough, unspecified: Secondary | ICD-10-CM | POA: Diagnosis not present

## 2022-08-05 DIAGNOSIS — I5022 Chronic systolic (congestive) heart failure: Secondary | ICD-10-CM | POA: Diagnosis not present

## 2022-08-18 DIAGNOSIS — J479 Bronchiectasis, uncomplicated: Secondary | ICD-10-CM | POA: Diagnosis not present

## 2022-08-18 DIAGNOSIS — R042 Hemoptysis: Secondary | ICD-10-CM | POA: Diagnosis not present

## 2022-08-27 DIAGNOSIS — I422 Other hypertrophic cardiomyopathy: Secondary | ICD-10-CM | POA: Diagnosis not present

## 2022-08-27 DIAGNOSIS — I5022 Chronic systolic (congestive) heart failure: Secondary | ICD-10-CM | POA: Diagnosis not present

## 2022-08-27 DIAGNOSIS — I2510 Atherosclerotic heart disease of native coronary artery without angina pectoris: Secondary | ICD-10-CM | POA: Diagnosis not present

## 2022-09-03 DIAGNOSIS — C61 Malignant neoplasm of prostate: Secondary | ICD-10-CM | POA: Diagnosis not present

## 2022-09-03 DIAGNOSIS — C7951 Secondary malignant neoplasm of bone: Secondary | ICD-10-CM | POA: Diagnosis not present

## 2022-09-04 DIAGNOSIS — I2510 Atherosclerotic heart disease of native coronary artery without angina pectoris: Secondary | ICD-10-CM | POA: Diagnosis not present

## 2022-09-04 DIAGNOSIS — R030 Elevated blood-pressure reading, without diagnosis of hypertension: Secondary | ICD-10-CM | POA: Diagnosis not present

## 2022-09-04 DIAGNOSIS — I422 Other hypertrophic cardiomyopathy: Secondary | ICD-10-CM | POA: Diagnosis not present

## 2022-09-04 DIAGNOSIS — M67449 Ganglion, unspecified hand: Secondary | ICD-10-CM | POA: Diagnosis not present

## 2022-09-04 DIAGNOSIS — Z6830 Body mass index (BMI) 30.0-30.9, adult: Secondary | ICD-10-CM | POA: Diagnosis not present

## 2022-09-04 DIAGNOSIS — C7951 Secondary malignant neoplasm of bone: Secondary | ICD-10-CM | POA: Diagnosis not present

## 2022-09-04 DIAGNOSIS — C61 Malignant neoplasm of prostate: Secondary | ICD-10-CM | POA: Diagnosis not present

## 2022-09-04 DIAGNOSIS — I4891 Unspecified atrial fibrillation: Secondary | ICD-10-CM | POA: Diagnosis not present

## 2022-09-04 DIAGNOSIS — I5022 Chronic systolic (congestive) heart failure: Secondary | ICD-10-CM | POA: Diagnosis not present

## 2022-10-01 ENCOUNTER — Other Ambulatory Visit: Payer: Self-pay | Admitting: Urology

## 2022-10-01 DIAGNOSIS — C7951 Secondary malignant neoplasm of bone: Secondary | ICD-10-CM | POA: Diagnosis not present

## 2022-10-01 DIAGNOSIS — C61 Malignant neoplasm of prostate: Secondary | ICD-10-CM | POA: Diagnosis not present

## 2022-10-08 DIAGNOSIS — C61 Malignant neoplasm of prostate: Secondary | ICD-10-CM | POA: Diagnosis not present

## 2022-10-13 DIAGNOSIS — R042 Hemoptysis: Secondary | ICD-10-CM | POA: Diagnosis not present

## 2022-10-13 DIAGNOSIS — J479 Bronchiectasis, uncomplicated: Secondary | ICD-10-CM | POA: Diagnosis not present

## 2022-10-15 DIAGNOSIS — C775 Secondary and unspecified malignant neoplasm of intrapelvic lymph nodes: Secondary | ICD-10-CM | POA: Diagnosis not present

## 2022-10-15 DIAGNOSIS — C7951 Secondary malignant neoplasm of bone: Secondary | ICD-10-CM | POA: Diagnosis not present

## 2022-10-15 DIAGNOSIS — C61 Malignant neoplasm of prostate: Secondary | ICD-10-CM | POA: Diagnosis not present

## 2022-10-29 DIAGNOSIS — C7951 Secondary malignant neoplasm of bone: Secondary | ICD-10-CM | POA: Diagnosis not present

## 2022-10-29 DIAGNOSIS — C61 Malignant neoplasm of prostate: Secondary | ICD-10-CM | POA: Diagnosis not present

## 2022-11-26 DIAGNOSIS — C61 Malignant neoplasm of prostate: Secondary | ICD-10-CM | POA: Diagnosis not present

## 2022-11-26 DIAGNOSIS — C7951 Secondary malignant neoplasm of bone: Secondary | ICD-10-CM | POA: Diagnosis not present

## 2022-11-27 DIAGNOSIS — R918 Other nonspecific abnormal finding of lung field: Secondary | ICD-10-CM | POA: Diagnosis not present

## 2022-11-27 DIAGNOSIS — C7951 Secondary malignant neoplasm of bone: Secondary | ICD-10-CM | POA: Diagnosis not present

## 2022-11-27 DIAGNOSIS — C61 Malignant neoplasm of prostate: Secondary | ICD-10-CM | POA: Diagnosis not present

## 2022-11-27 DIAGNOSIS — Z131 Encounter for screening for diabetes mellitus: Secondary | ICD-10-CM | POA: Diagnosis not present

## 2022-12-04 DIAGNOSIS — I2510 Atherosclerotic heart disease of native coronary artery without angina pectoris: Secondary | ICD-10-CM | POA: Diagnosis not present

## 2022-12-04 DIAGNOSIS — C61 Malignant neoplasm of prostate: Secondary | ICD-10-CM | POA: Diagnosis not present

## 2022-12-04 DIAGNOSIS — Z6829 Body mass index (BMI) 29.0-29.9, adult: Secondary | ICD-10-CM | POA: Diagnosis not present

## 2022-12-04 DIAGNOSIS — I422 Other hypertrophic cardiomyopathy: Secondary | ICD-10-CM | POA: Diagnosis not present

## 2022-12-04 DIAGNOSIS — I4891 Unspecified atrial fibrillation: Secondary | ICD-10-CM | POA: Diagnosis not present

## 2022-12-04 DIAGNOSIS — M67449 Ganglion, unspecified hand: Secondary | ICD-10-CM | POA: Diagnosis not present

## 2022-12-04 DIAGNOSIS — J479 Bronchiectasis, uncomplicated: Secondary | ICD-10-CM | POA: Diagnosis not present

## 2022-12-04 DIAGNOSIS — C7951 Secondary malignant neoplasm of bone: Secondary | ICD-10-CM | POA: Diagnosis not present

## 2022-12-04 DIAGNOSIS — I5022 Chronic systolic (congestive) heart failure: Secondary | ICD-10-CM | POA: Diagnosis not present

## 2022-12-24 DIAGNOSIS — C7951 Secondary malignant neoplasm of bone: Secondary | ICD-10-CM | POA: Diagnosis not present

## 2022-12-24 DIAGNOSIS — C61 Malignant neoplasm of prostate: Secondary | ICD-10-CM | POA: Diagnosis not present

## 2023-01-21 DIAGNOSIS — C61 Malignant neoplasm of prostate: Secondary | ICD-10-CM | POA: Diagnosis not present

## 2023-01-21 DIAGNOSIS — C7951 Secondary malignant neoplasm of bone: Secondary | ICD-10-CM | POA: Diagnosis not present

## 2023-01-22 IMAGING — PT NM PET TUM IMG SKULL BASE T - THIGH
7 series · 25 of 25 positions shown · non-contrast
Comparison: CT abdomen pelvis 03/11/2017, 10/10/2015. Bone scan
10/02/2016

CLINICAL DATA: Prostate cancer with bone metastasis. Radiation
treatment 5102. Currently on hormone therapy. Elevated PSA equal
0.2.

EXAM:
NUCLEAR MEDICINE PET SKULL BASE TO THIGH
TECHNIQUE: 9.5 mCi F18 Piflufolastat (Pylarify) was injected intravenously.
Full-ring PET imaging was performed from the skull base to thigh
after the radiotracer. CT data was obtained and used for attenuation
correction and anatomic localization.

[Series 3: pet sk_thigh ac · axial · 5.0mm · 4.07mm/px · z∈[-909,+39]mm · 6 of 238 slices shown]
[im 1/238]
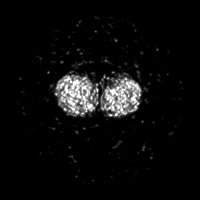
[im 48/238]
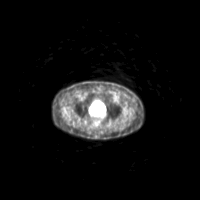
[im 95/238]
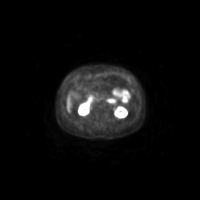
[im 143/238]
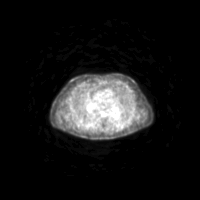
[im 190/238]
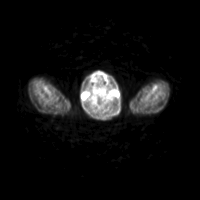
[im 238/238]
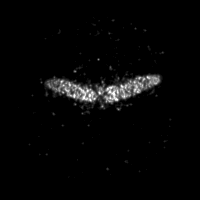

[Series 4: ct sk_thigh 5.0 bf37 · axial · 5.0mm · 0.98mm/px · z∈[-909,+39]mm · 5 of 238 slices shown]
[im 1/238]
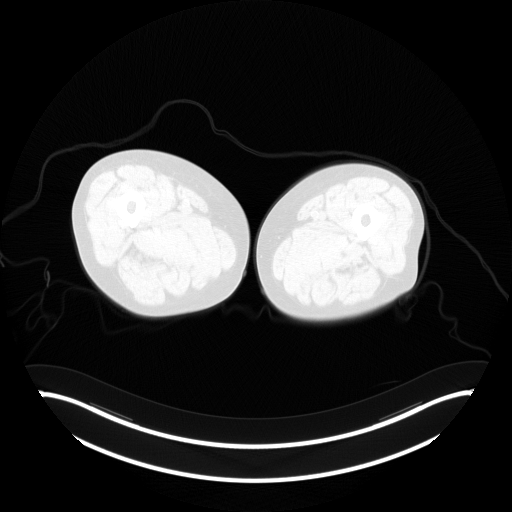
[im 60/238]
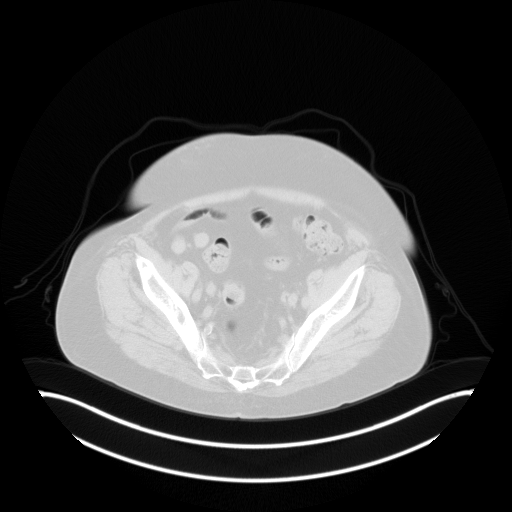
[im 119/238]
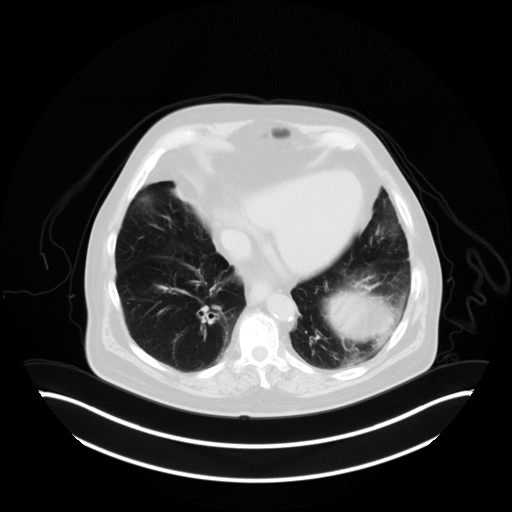
[im 178/238]
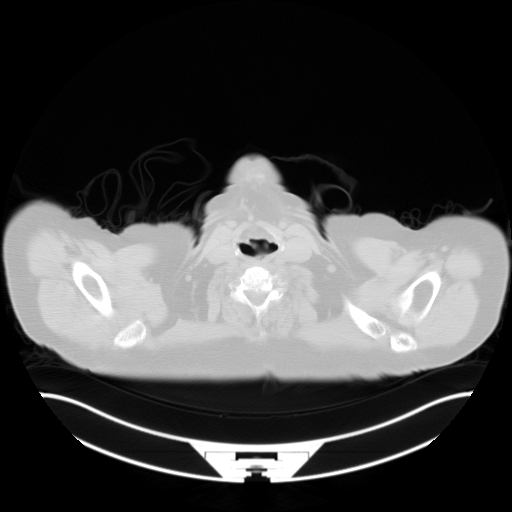
[im 238/238]
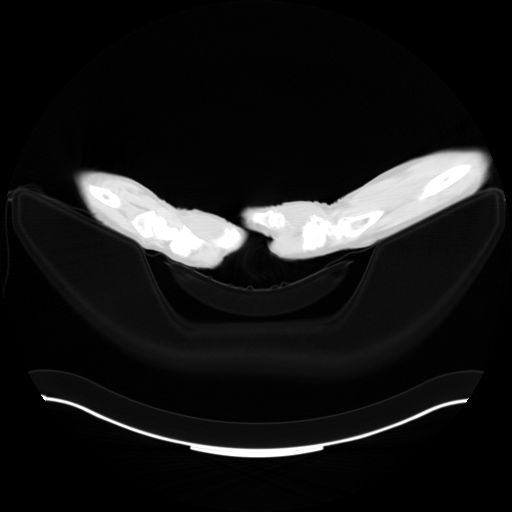

[Series 5: pet sk_thigh nac · axial · 5.0mm · 4.07mm/px · z∈[-909,+39]mm · 5 of 238 slices shown]
[im 1/238]
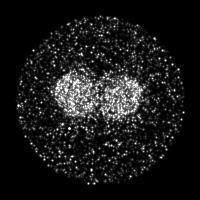
[im 60/238]
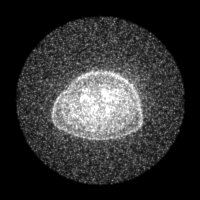
[im 119/238]
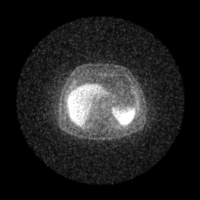
[im 178/238]
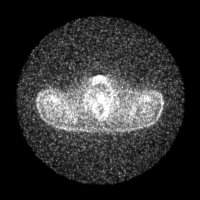
[im 238/238]
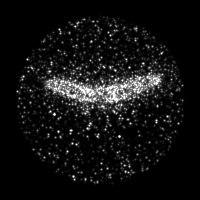

[Series 8: ct sk_thigh 5.0 br59 (id)_bone · axial · 5.0mm · 0.73mm/px · z∈[-485,-213]mm · 2 of 69 slices shown]
[im 1/69]
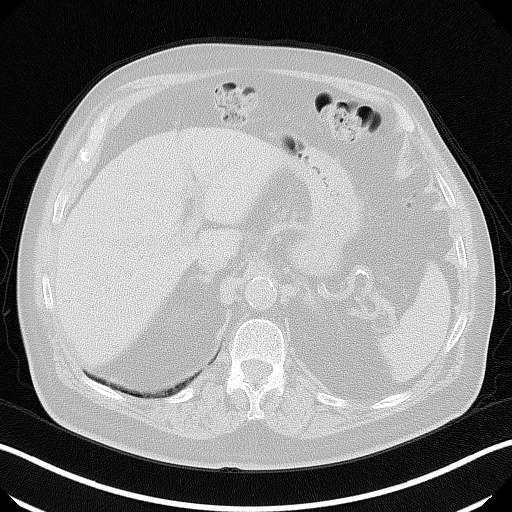
[im 69/69]
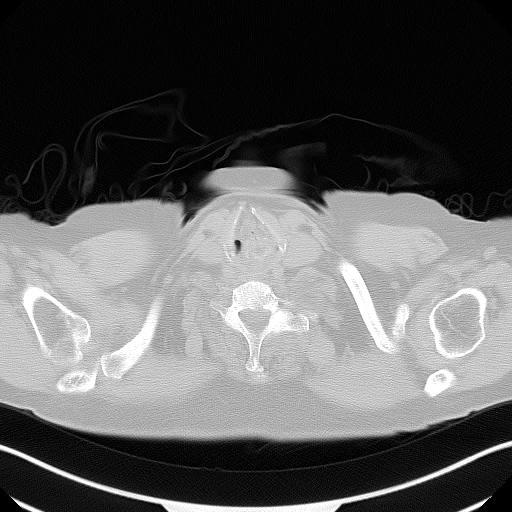

[Series 603: <mip collection> · coronal · 1.97mm/px · 1 of 32 slices shown]
[im 1/32]
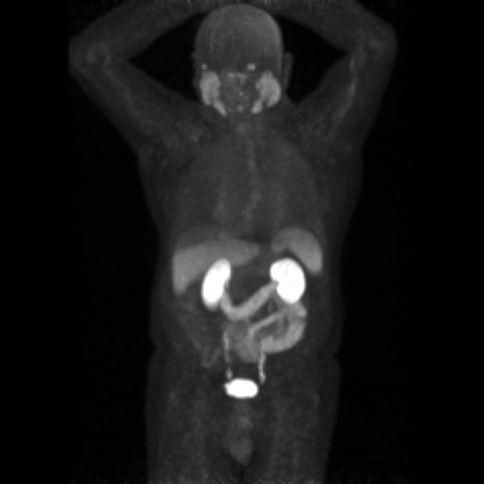

[Series 604: fused cor · 1 of 56 slices shown]
[im 1/56]
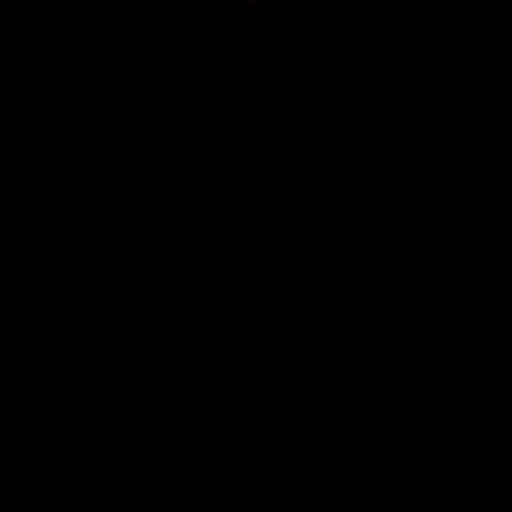

[Series 605: range-ct sk_thigh 5.0 bf37-tra-<alpha range> · 5 of 229 slices shown]
[im 1/229]
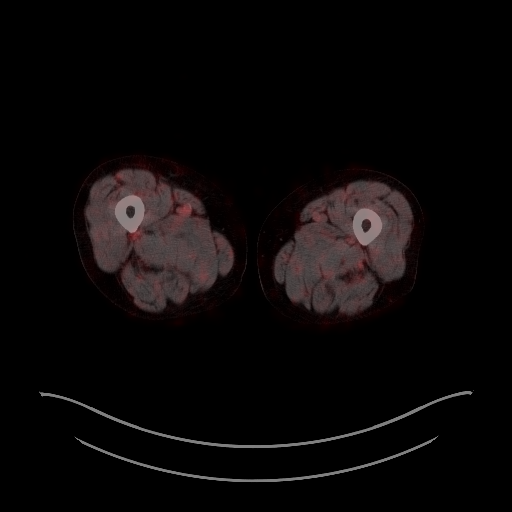
[im 58/229]
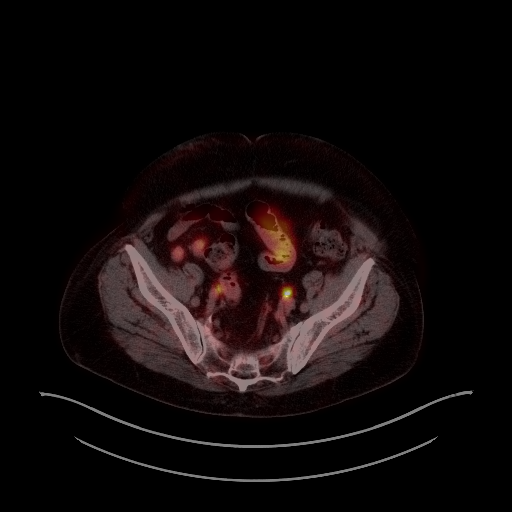
[im 115/229]
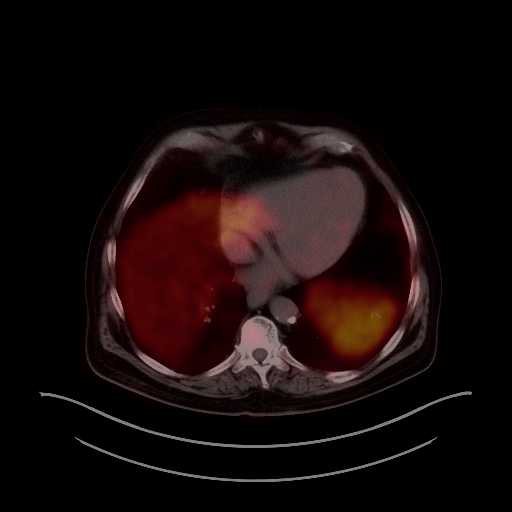
[im 172/229]
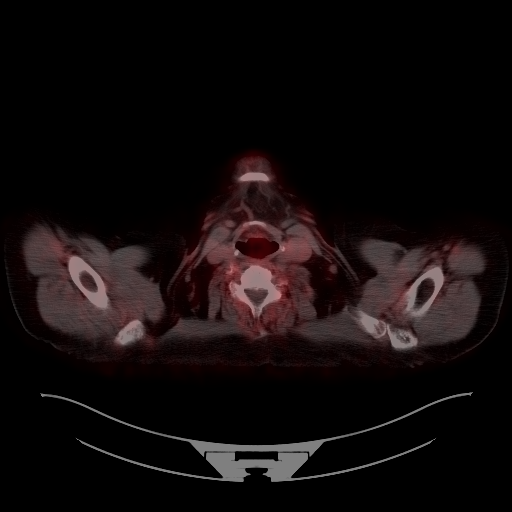
[im 229/229]
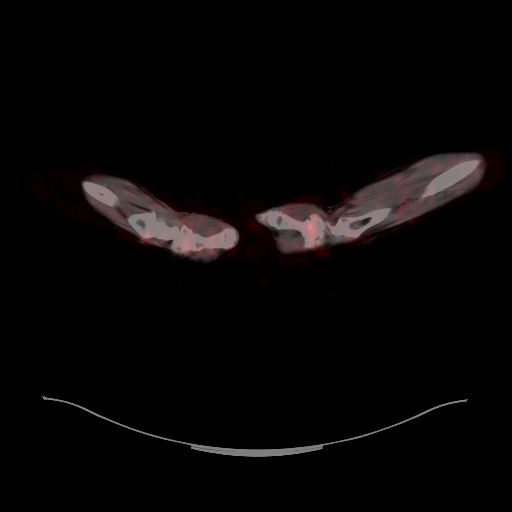

[25 of 25 positions shown; findings below may reference images not displayed]

FINDINGS: NECK

No radiotracer activity in neck lymph nodes.

Incidental CT finding: None

CHEST

No radiotracer accumulation within mediastinal or hilar lymph nodes.
No suspicious pulmonary nodules on the CT scan.

Incidental CT finding: Coronary artery calcification and aortic
atherosclerotic calcification.

ABDOMEN/PELVIS

Prostate: No focal activity in the prostate bed.

Lymph nodes: No abnormal radiotracer accumulation within pelvic or
abdominal nodes.

Liver: No evidence of liver metastasis

Incidental CT finding: Atherosclerotic calcification of the aorta.

SKELETON

Intense radiotracer activity associated with a lytic lesion in the
inferior endplate of the L3 vertebral body SUV max equal 10.3. This
lesion has been present since 1862 however was new from from 0235.

Second radiotracer avid sclerotic lesion within the sternum with SUV
max equal 4.2 measures 5 mm on image 106.

There is a sclerotic lesion within the RIGHT iliac bone measuring 11
mm (image 173/4) with no significant radiotracer activity. This
lesion is increased in sclerotic density from 1862.

Lesion within the lower thoracic spine (T11) has dense sclerosis and
trabeculation at without significant radiotracer activity. This
lesion was radiotracer avid on MDP bone scan of 10/02/2016. Findings
most consistent treated metastasis.
IMPRESSION: 1. No evidence of prostate carcinoma recurrence in the prostatectomy
bed.
2. No evidence of metastatic adenopathy in the pelvis or periaortic
retroperitoneum.
3. No evidence of visceral metastasis.
4. Evidence active prostate cancer skeletal metastasis. Intense
activity associated L3 lesion which has been present for multiple
years. Activity associated with a small sternal lesion additionally.
There are multiple additional sclerotic lesions which do not have
radiotracer activity. Findings most consistent with mixed response
prostate cancer skeletal metastasis.

## 2023-02-18 DIAGNOSIS — C61 Malignant neoplasm of prostate: Secondary | ICD-10-CM | POA: Diagnosis not present

## 2023-02-18 DIAGNOSIS — C7951 Secondary malignant neoplasm of bone: Secondary | ICD-10-CM | POA: Diagnosis not present

## 2023-02-23 DIAGNOSIS — I2510 Atherosclerotic heart disease of native coronary artery without angina pectoris: Secondary | ICD-10-CM | POA: Diagnosis not present

## 2023-02-23 DIAGNOSIS — I4819 Other persistent atrial fibrillation: Secondary | ICD-10-CM | POA: Diagnosis not present

## 2023-02-23 DIAGNOSIS — I422 Other hypertrophic cardiomyopathy: Secondary | ICD-10-CM | POA: Diagnosis not present

## 2023-02-25 DIAGNOSIS — C61 Malignant neoplasm of prostate: Secondary | ICD-10-CM | POA: Diagnosis not present

## 2023-02-26 DIAGNOSIS — Z1329 Encounter for screening for other suspected endocrine disorder: Secondary | ICD-10-CM | POA: Diagnosis not present

## 2023-02-26 DIAGNOSIS — Z0000 Encounter for general adult medical examination without abnormal findings: Secondary | ICD-10-CM | POA: Diagnosis not present

## 2023-02-26 DIAGNOSIS — Z131 Encounter for screening for diabetes mellitus: Secondary | ICD-10-CM | POA: Diagnosis not present

## 2023-02-26 DIAGNOSIS — E559 Vitamin D deficiency, unspecified: Secondary | ICD-10-CM | POA: Diagnosis not present

## 2023-02-26 DIAGNOSIS — E7801 Familial hypercholesterolemia: Secondary | ICD-10-CM | POA: Diagnosis not present

## 2023-02-26 DIAGNOSIS — Z13220 Encounter for screening for lipoid disorders: Secondary | ICD-10-CM | POA: Diagnosis not present

## 2023-03-04 DIAGNOSIS — C775 Secondary and unspecified malignant neoplasm of intrapelvic lymph nodes: Secondary | ICD-10-CM | POA: Diagnosis not present

## 2023-03-04 DIAGNOSIS — C61 Malignant neoplasm of prostate: Secondary | ICD-10-CM | POA: Diagnosis not present

## 2023-03-05 DIAGNOSIS — M67449 Ganglion, unspecified hand: Secondary | ICD-10-CM | POA: Diagnosis not present

## 2023-03-05 DIAGNOSIS — I4891 Unspecified atrial fibrillation: Secondary | ICD-10-CM | POA: Diagnosis not present

## 2023-03-05 DIAGNOSIS — I422 Other hypertrophic cardiomyopathy: Secondary | ICD-10-CM | POA: Diagnosis not present

## 2023-03-05 DIAGNOSIS — C61 Malignant neoplasm of prostate: Secondary | ICD-10-CM | POA: Diagnosis not present

## 2023-03-05 DIAGNOSIS — Z0001 Encounter for general adult medical examination with abnormal findings: Secondary | ICD-10-CM | POA: Diagnosis not present

## 2023-03-05 DIAGNOSIS — J479 Bronchiectasis, uncomplicated: Secondary | ICD-10-CM | POA: Diagnosis not present

## 2023-03-05 DIAGNOSIS — C7951 Secondary malignant neoplasm of bone: Secondary | ICD-10-CM | POA: Diagnosis not present

## 2023-03-05 DIAGNOSIS — I5022 Chronic systolic (congestive) heart failure: Secondary | ICD-10-CM | POA: Diagnosis not present

## 2023-03-05 DIAGNOSIS — I2510 Atherosclerotic heart disease of native coronary artery without angina pectoris: Secondary | ICD-10-CM | POA: Diagnosis not present

## 2023-03-05 DIAGNOSIS — Z23 Encounter for immunization: Secondary | ICD-10-CM | POA: Diagnosis not present

## 2023-03-18 DIAGNOSIS — C7951 Secondary malignant neoplasm of bone: Secondary | ICD-10-CM | POA: Diagnosis not present

## 2023-03-18 DIAGNOSIS — C61 Malignant neoplasm of prostate: Secondary | ICD-10-CM | POA: Diagnosis not present

## 2023-04-09 DIAGNOSIS — E7800 Pure hypercholesterolemia, unspecified: Secondary | ICD-10-CM | POA: Diagnosis not present

## 2023-04-09 DIAGNOSIS — E559 Vitamin D deficiency, unspecified: Secondary | ICD-10-CM | POA: Diagnosis not present

## 2023-04-09 DIAGNOSIS — I4891 Unspecified atrial fibrillation: Secondary | ICD-10-CM | POA: Diagnosis not present

## 2023-04-09 DIAGNOSIS — C61 Malignant neoplasm of prostate: Secondary | ICD-10-CM | POA: Diagnosis not present

## 2023-04-15 DIAGNOSIS — C7951 Secondary malignant neoplasm of bone: Secondary | ICD-10-CM | POA: Diagnosis not present

## 2023-04-15 DIAGNOSIS — C61 Malignant neoplasm of prostate: Secondary | ICD-10-CM | POA: Diagnosis not present

## 2023-05-13 DIAGNOSIS — C61 Malignant neoplasm of prostate: Secondary | ICD-10-CM | POA: Diagnosis not present

## 2023-05-13 DIAGNOSIS — C7951 Secondary malignant neoplasm of bone: Secondary | ICD-10-CM | POA: Diagnosis not present

## 2023-06-11 DIAGNOSIS — R229 Localized swelling, mass and lump, unspecified: Secondary | ICD-10-CM | POA: Diagnosis not present

## 2023-06-11 DIAGNOSIS — L821 Other seborrheic keratosis: Secondary | ICD-10-CM | POA: Diagnosis not present

## 2023-06-11 DIAGNOSIS — C44222 Squamous cell carcinoma of skin of right ear and external auricular canal: Secondary | ICD-10-CM | POA: Diagnosis not present

## 2023-06-11 DIAGNOSIS — L578 Other skin changes due to chronic exposure to nonionizing radiation: Secondary | ICD-10-CM | POA: Diagnosis not present

## 2023-06-11 DIAGNOSIS — C44329 Squamous cell carcinoma of skin of other parts of face: Secondary | ICD-10-CM | POA: Diagnosis not present

## 2023-06-11 DIAGNOSIS — L814 Other melanin hyperpigmentation: Secondary | ICD-10-CM | POA: Diagnosis not present

## 2023-06-11 DIAGNOSIS — Z85828 Personal history of other malignant neoplasm of skin: Secondary | ICD-10-CM | POA: Diagnosis not present

## 2023-06-11 DIAGNOSIS — D485 Neoplasm of uncertain behavior of skin: Secondary | ICD-10-CM | POA: Diagnosis not present

## 2023-06-11 DIAGNOSIS — Z08 Encounter for follow-up examination after completed treatment for malignant neoplasm: Secondary | ICD-10-CM | POA: Diagnosis not present

## 2023-06-11 DIAGNOSIS — D229 Melanocytic nevi, unspecified: Secondary | ICD-10-CM | POA: Diagnosis not present

## 2023-06-11 DIAGNOSIS — L570 Actinic keratosis: Secondary | ICD-10-CM | POA: Diagnosis not present

## 2023-06-17 DIAGNOSIS — C7951 Secondary malignant neoplasm of bone: Secondary | ICD-10-CM | POA: Diagnosis not present

## 2023-06-17 DIAGNOSIS — C61 Malignant neoplasm of prostate: Secondary | ICD-10-CM | POA: Diagnosis not present

## 2023-07-14 DIAGNOSIS — C44329 Squamous cell carcinoma of skin of other parts of face: Secondary | ICD-10-CM | POA: Diagnosis not present

## 2023-07-15 DIAGNOSIS — C61 Malignant neoplasm of prostate: Secondary | ICD-10-CM | POA: Diagnosis not present

## 2023-07-15 DIAGNOSIS — C7951 Secondary malignant neoplasm of bone: Secondary | ICD-10-CM | POA: Diagnosis not present

## 2023-07-22 DIAGNOSIS — C7951 Secondary malignant neoplasm of bone: Secondary | ICD-10-CM | POA: Diagnosis not present

## 2023-07-22 DIAGNOSIS — C61 Malignant neoplasm of prostate: Secondary | ICD-10-CM | POA: Diagnosis not present

## 2023-07-22 DIAGNOSIS — C775 Secondary and unspecified malignant neoplasm of intrapelvic lymph nodes: Secondary | ICD-10-CM | POA: Diagnosis not present

## 2023-07-23 DIAGNOSIS — C44222 Squamous cell carcinoma of skin of right ear and external auricular canal: Secondary | ICD-10-CM | POA: Diagnosis not present

## 2023-07-29 ENCOUNTER — Other Ambulatory Visit (HOSPITAL_COMMUNITY): Payer: Self-pay | Admitting: Urology

## 2023-07-29 DIAGNOSIS — C61 Malignant neoplasm of prostate: Secondary | ICD-10-CM

## 2023-07-29 DIAGNOSIS — C7951 Secondary malignant neoplasm of bone: Secondary | ICD-10-CM

## 2023-07-29 DIAGNOSIS — C775 Secondary and unspecified malignant neoplasm of intrapelvic lymph nodes: Secondary | ICD-10-CM

## 2023-08-03 ENCOUNTER — Other Ambulatory Visit (HOSPITAL_COMMUNITY): Payer: Self-pay | Admitting: Urology

## 2023-08-03 DIAGNOSIS — C7951 Secondary malignant neoplasm of bone: Secondary | ICD-10-CM

## 2023-08-03 DIAGNOSIS — C775 Secondary and unspecified malignant neoplasm of intrapelvic lymph nodes: Secondary | ICD-10-CM

## 2023-08-03 DIAGNOSIS — C61 Malignant neoplasm of prostate: Secondary | ICD-10-CM

## 2023-08-12 DIAGNOSIS — C61 Malignant neoplasm of prostate: Secondary | ICD-10-CM | POA: Diagnosis not present

## 2023-08-12 DIAGNOSIS — C7951 Secondary malignant neoplasm of bone: Secondary | ICD-10-CM | POA: Diagnosis not present

## 2023-08-18 ENCOUNTER — Encounter (HOSPITAL_COMMUNITY)
Admission: RE | Admit: 2023-08-18 | Discharge: 2023-08-18 | Disposition: A | Discharge status: Home / Self Care | Payer: Medicare PPO | Source: Ambulatory Visit | Attending: Urology | Admitting: Urology

## 2023-08-18 DIAGNOSIS — C61 Malignant neoplasm of prostate: Secondary | ICD-10-CM | POA: Insufficient documentation

## 2023-08-18 DIAGNOSIS — C7951 Secondary malignant neoplasm of bone: Secondary | ICD-10-CM | POA: Diagnosis not present

## 2023-08-18 DIAGNOSIS — C775 Secondary and unspecified malignant neoplasm of intrapelvic lymph nodes: Secondary | ICD-10-CM | POA: Insufficient documentation

## 2023-08-18 MED ORDER — GALLIUM GA 68 GOZETOTIDE 25 MCG IV KIT
6.6670 | PACK | Freq: Once | INTRAVENOUS | Status: AC
  Administered 2023-08-18: 6.667 via INTRAVENOUS

## 2023-09-09 DIAGNOSIS — C61 Malignant neoplasm of prostate: Secondary | ICD-10-CM | POA: Diagnosis not present

## 2023-09-09 DIAGNOSIS — C7951 Secondary malignant neoplasm of bone: Secondary | ICD-10-CM | POA: Diagnosis not present

## 2023-09-23 DIAGNOSIS — C61 Malignant neoplasm of prostate: Secondary | ICD-10-CM | POA: Diagnosis not present

## 2023-09-30 DIAGNOSIS — C7951 Secondary malignant neoplasm of bone: Secondary | ICD-10-CM | POA: Diagnosis not present

## 2023-09-30 DIAGNOSIS — C775 Secondary and unspecified malignant neoplasm of intrapelvic lymph nodes: Secondary | ICD-10-CM | POA: Diagnosis not present

## 2023-09-30 DIAGNOSIS — C61 Malignant neoplasm of prostate: Secondary | ICD-10-CM | POA: Diagnosis not present

## 2023-09-30 DIAGNOSIS — N401 Enlarged prostate with lower urinary tract symptoms: Secondary | ICD-10-CM | POA: Diagnosis not present

## 2023-09-30 DIAGNOSIS — R351 Nocturia: Secondary | ICD-10-CM | POA: Diagnosis not present

## 2023-10-07 ENCOUNTER — Other Ambulatory Visit: Payer: Self-pay | Admitting: Urology

## 2023-10-07 DIAGNOSIS — I4891 Unspecified atrial fibrillation: Secondary | ICD-10-CM | POA: Diagnosis not present

## 2023-10-07 DIAGNOSIS — C61 Malignant neoplasm of prostate: Secondary | ICD-10-CM | POA: Diagnosis not present

## 2023-10-07 DIAGNOSIS — I422 Other hypertrophic cardiomyopathy: Secondary | ICD-10-CM | POA: Diagnosis not present

## 2023-10-07 DIAGNOSIS — C7951 Secondary malignant neoplasm of bone: Secondary | ICD-10-CM | POA: Diagnosis not present

## 2023-10-09 DIAGNOSIS — I4891 Unspecified atrial fibrillation: Secondary | ICD-10-CM | POA: Diagnosis not present

## 2023-10-09 DIAGNOSIS — R7303 Prediabetes: Secondary | ICD-10-CM | POA: Diagnosis not present

## 2023-10-09 DIAGNOSIS — E039 Hypothyroidism, unspecified: Secondary | ICD-10-CM | POA: Diagnosis not present

## 2023-10-09 DIAGNOSIS — I422 Other hypertrophic cardiomyopathy: Secondary | ICD-10-CM | POA: Diagnosis not present

## 2023-10-09 DIAGNOSIS — Z6829 Body mass index (BMI) 29.0-29.9, adult: Secondary | ICD-10-CM | POA: Diagnosis not present

## 2023-10-09 DIAGNOSIS — I5022 Chronic systolic (congestive) heart failure: Secondary | ICD-10-CM | POA: Diagnosis not present

## 2023-10-09 DIAGNOSIS — I2510 Atherosclerotic heart disease of native coronary artery without angina pectoris: Secondary | ICD-10-CM | POA: Diagnosis not present

## 2023-10-27 DIAGNOSIS — I10 Essential (primary) hypertension: Secondary | ICD-10-CM | POA: Diagnosis not present

## 2023-10-27 DIAGNOSIS — Z130 Encounter for screening for diseases of the blood and blood-forming organs and certain disorders involving the immune mechanism: Secondary | ICD-10-CM | POA: Diagnosis not present

## 2023-10-27 DIAGNOSIS — R7303 Prediabetes: Secondary | ICD-10-CM | POA: Diagnosis not present

## 2023-10-27 DIAGNOSIS — D649 Anemia, unspecified: Secondary | ICD-10-CM | POA: Diagnosis not present

## 2023-11-04 DIAGNOSIS — C61 Malignant neoplasm of prostate: Secondary | ICD-10-CM | POA: Diagnosis not present

## 2023-11-04 DIAGNOSIS — C7951 Secondary malignant neoplasm of bone: Secondary | ICD-10-CM | POA: Diagnosis not present

## 2023-11-06 DIAGNOSIS — C61 Malignant neoplasm of prostate: Secondary | ICD-10-CM | POA: Diagnosis not present

## 2023-11-06 DIAGNOSIS — I4891 Unspecified atrial fibrillation: Secondary | ICD-10-CM | POA: Diagnosis not present

## 2023-11-06 DIAGNOSIS — I422 Other hypertrophic cardiomyopathy: Secondary | ICD-10-CM | POA: Diagnosis not present

## 2023-11-10 DIAGNOSIS — I10 Essential (primary) hypertension: Secondary | ICD-10-CM | POA: Diagnosis not present

## 2023-11-30 DIAGNOSIS — I10 Essential (primary) hypertension: Secondary | ICD-10-CM | POA: Diagnosis not present

## 2023-11-30 DIAGNOSIS — R7303 Prediabetes: Secondary | ICD-10-CM | POA: Diagnosis not present

## 2023-12-02 DIAGNOSIS — C61 Malignant neoplasm of prostate: Secondary | ICD-10-CM | POA: Diagnosis not present

## 2023-12-02 DIAGNOSIS — C7951 Secondary malignant neoplasm of bone: Secondary | ICD-10-CM | POA: Diagnosis not present

## 2023-12-07 DIAGNOSIS — G610 Guillain-Barre syndrome: Secondary | ICD-10-CM | POA: Diagnosis not present

## 2023-12-07 DIAGNOSIS — I4891 Unspecified atrial fibrillation: Secondary | ICD-10-CM | POA: Diagnosis not present

## 2023-12-07 DIAGNOSIS — I422 Other hypertrophic cardiomyopathy: Secondary | ICD-10-CM | POA: Diagnosis not present

## 2023-12-30 DIAGNOSIS — C7951 Secondary malignant neoplasm of bone: Secondary | ICD-10-CM | POA: Diagnosis not present

## 2023-12-30 DIAGNOSIS — C61 Malignant neoplasm of prostate: Secondary | ICD-10-CM | POA: Diagnosis not present

## 2024-01-07 DIAGNOSIS — G610 Guillain-Barre syndrome: Secondary | ICD-10-CM | POA: Diagnosis not present

## 2024-01-07 DIAGNOSIS — I422 Other hypertrophic cardiomyopathy: Secondary | ICD-10-CM | POA: Diagnosis not present

## 2024-01-07 DIAGNOSIS — I4891 Unspecified atrial fibrillation: Secondary | ICD-10-CM | POA: Diagnosis not present

## 2024-01-11 DIAGNOSIS — I422 Other hypertrophic cardiomyopathy: Secondary | ICD-10-CM | POA: Diagnosis not present

## 2024-01-11 DIAGNOSIS — I4819 Other persistent atrial fibrillation: Secondary | ICD-10-CM | POA: Diagnosis not present

## 2024-01-14 DIAGNOSIS — L03116 Cellulitis of left lower limb: Secondary | ICD-10-CM | POA: Diagnosis not present

## 2024-01-25 DIAGNOSIS — L03116 Cellulitis of left lower limb: Secondary | ICD-10-CM | POA: Diagnosis not present

## 2024-01-27 DIAGNOSIS — C61 Malignant neoplasm of prostate: Secondary | ICD-10-CM | POA: Diagnosis not present

## 2024-01-27 DIAGNOSIS — C7951 Secondary malignant neoplasm of bone: Secondary | ICD-10-CM | POA: Diagnosis not present

## 2024-02-04 DIAGNOSIS — I422 Other hypertrophic cardiomyopathy: Secondary | ICD-10-CM | POA: Diagnosis not present

## 2024-02-04 DIAGNOSIS — I4891 Unspecified atrial fibrillation: Secondary | ICD-10-CM | POA: Diagnosis not present

## 2024-02-04 DIAGNOSIS — G610 Guillain-Barre syndrome: Secondary | ICD-10-CM | POA: Diagnosis not present

## 2024-02-05 DIAGNOSIS — R7303 Prediabetes: Secondary | ICD-10-CM | POA: Diagnosis not present

## 2024-02-05 DIAGNOSIS — Z0001 Encounter for general adult medical examination with abnormal findings: Secondary | ICD-10-CM | POA: Diagnosis not present

## 2024-02-05 DIAGNOSIS — E039 Hypothyroidism, unspecified: Secondary | ICD-10-CM | POA: Diagnosis not present

## 2024-02-05 DIAGNOSIS — I10 Essential (primary) hypertension: Secondary | ICD-10-CM | POA: Diagnosis not present

## 2024-02-11 DIAGNOSIS — Z20828 Contact with and (suspected) exposure to other viral communicable diseases: Secondary | ICD-10-CM | POA: Diagnosis not present

## 2024-02-11 DIAGNOSIS — Z6828 Body mass index (BMI) 28.0-28.9, adult: Secondary | ICD-10-CM | POA: Diagnosis not present

## 2024-02-11 DIAGNOSIS — U071 COVID-19: Secondary | ICD-10-CM | POA: Diagnosis not present

## 2024-02-24 DIAGNOSIS — C7951 Secondary malignant neoplasm of bone: Secondary | ICD-10-CM | POA: Diagnosis not present

## 2024-02-24 DIAGNOSIS — C61 Malignant neoplasm of prostate: Secondary | ICD-10-CM | POA: Diagnosis not present

## 2024-03-02 DIAGNOSIS — C7951 Secondary malignant neoplasm of bone: Secondary | ICD-10-CM | POA: Diagnosis not present

## 2024-03-02 DIAGNOSIS — C61 Malignant neoplasm of prostate: Secondary | ICD-10-CM | POA: Diagnosis not present

## 2024-03-08 DIAGNOSIS — I422 Other hypertrophic cardiomyopathy: Secondary | ICD-10-CM | POA: Diagnosis not present

## 2024-03-08 DIAGNOSIS — I4891 Unspecified atrial fibrillation: Secondary | ICD-10-CM | POA: Diagnosis not present

## 2024-03-08 DIAGNOSIS — G610 Guillain-Barre syndrome: Secondary | ICD-10-CM | POA: Diagnosis not present

## 2024-04-08 DIAGNOSIS — I422 Other hypertrophic cardiomyopathy: Secondary | ICD-10-CM | POA: Diagnosis not present

## 2024-04-08 DIAGNOSIS — G610 Guillain-Barre syndrome: Secondary | ICD-10-CM | POA: Diagnosis not present

## 2024-04-08 DIAGNOSIS — I4891 Unspecified atrial fibrillation: Secondary | ICD-10-CM | POA: Diagnosis not present

## 2024-04-12 DIAGNOSIS — D559 Anemia due to enzyme disorder, unspecified: Secondary | ICD-10-CM | POA: Diagnosis not present

## 2024-04-12 DIAGNOSIS — R7303 Prediabetes: Secondary | ICD-10-CM | POA: Diagnosis not present

## 2024-04-12 DIAGNOSIS — I10 Essential (primary) hypertension: Secondary | ICD-10-CM | POA: Diagnosis not present

## 2024-04-12 DIAGNOSIS — C61 Malignant neoplasm of prostate: Secondary | ICD-10-CM | POA: Diagnosis not present

## 2024-04-12 DIAGNOSIS — Z13220 Encounter for screening for lipoid disorders: Secondary | ICD-10-CM | POA: Diagnosis not present

## 2024-04-12 DIAGNOSIS — E559 Vitamin D deficiency, unspecified: Secondary | ICD-10-CM | POA: Diagnosis not present

## 2024-04-12 DIAGNOSIS — Z0001 Encounter for general adult medical examination with abnormal findings: Secondary | ICD-10-CM | POA: Diagnosis not present

## 2024-04-12 DIAGNOSIS — E039 Hypothyroidism, unspecified: Secondary | ICD-10-CM | POA: Diagnosis not present

## 2024-04-13 DIAGNOSIS — C61 Malignant neoplasm of prostate: Secondary | ICD-10-CM | POA: Diagnosis not present

## 2024-04-13 DIAGNOSIS — R399 Unspecified symptoms and signs involving the genitourinary system: Secondary | ICD-10-CM | POA: Diagnosis not present

## 2024-04-13 DIAGNOSIS — R310 Gross hematuria: Secondary | ICD-10-CM | POA: Diagnosis not present

## 2024-04-19 DIAGNOSIS — Z1331 Encounter for screening for depression: Secondary | ICD-10-CM | POA: Diagnosis not present

## 2024-04-19 DIAGNOSIS — I4891 Unspecified atrial fibrillation: Secondary | ICD-10-CM | POA: Diagnosis not present

## 2024-04-19 DIAGNOSIS — I422 Other hypertrophic cardiomyopathy: Secondary | ICD-10-CM | POA: Diagnosis not present

## 2024-04-19 DIAGNOSIS — Z0001 Encounter for general adult medical examination with abnormal findings: Secondary | ICD-10-CM | POA: Diagnosis not present

## 2024-04-19 DIAGNOSIS — E039 Hypothyroidism, unspecified: Secondary | ICD-10-CM | POA: Diagnosis not present

## 2024-04-19 DIAGNOSIS — I10 Essential (primary) hypertension: Secondary | ICD-10-CM | POA: Diagnosis not present

## 2024-04-19 DIAGNOSIS — R7303 Prediabetes: Secondary | ICD-10-CM | POA: Diagnosis not present

## 2024-04-19 DIAGNOSIS — J479 Bronchiectasis, uncomplicated: Secondary | ICD-10-CM | POA: Diagnosis not present

## 2024-04-19 DIAGNOSIS — I5022 Chronic systolic (congestive) heart failure: Secondary | ICD-10-CM | POA: Diagnosis not present
